# Patient Record
Sex: Male | Born: 1955 | Race: White | Hispanic: No | Marital: Married | Smoking: Former smoker
Health system: Southern US, Community
[De-identification: ages and names within clinical notes are randomized; demographics above are authoritative.]

## PROBLEM LIST (undated history)

## (undated) DIAGNOSIS — F988 Other specified behavioral and emotional disorders with onset usually occurring in childhood and adolescence: Secondary | ICD-10-CM

## (undated) DIAGNOSIS — F909 Attention-deficit hyperactivity disorder, unspecified type: Secondary | ICD-10-CM

## (undated) DIAGNOSIS — K219 Gastro-esophageal reflux disease without esophagitis: Secondary | ICD-10-CM

## (undated) DIAGNOSIS — M1990 Unspecified osteoarthritis, unspecified site: Secondary | ICD-10-CM

## (undated) DIAGNOSIS — Z87442 Personal history of urinary calculi: Secondary | ICD-10-CM

## (undated) DIAGNOSIS — I10 Essential (primary) hypertension: Secondary | ICD-10-CM

## (undated) HISTORY — PX: JOINT REPLACEMENT: SHX530

## (undated) HISTORY — PX: TONSILLECTOMY: SUR1361

## (undated) HISTORY — PX: KNEE SURGERY: SHX244

## (undated) HISTORY — PX: CHOLECYSTECTOMY: SHX55

## (undated) HISTORY — PX: GASTRIC BYPASS: SHX52

## (undated) HISTORY — PX: SHOULDER ARTHROSCOPY WITH SUBACROMIAL DECOMPRESSION: SHX5684

## (undated) HISTORY — PX: THUMB ARTHROSCOPY: SHX2509

---

## 2005-03-04 ENCOUNTER — Other Ambulatory Visit: Payer: Self-pay

## 2005-03-11 ENCOUNTER — Ambulatory Visit: Payer: Self-pay | Admitting: General Practice

## 2005-06-01 ENCOUNTER — Encounter: Payer: Self-pay | Admitting: General Practice

## 2006-06-21 ENCOUNTER — Ambulatory Visit: Payer: Self-pay

## 2006-08-02 ENCOUNTER — Other Ambulatory Visit: Payer: Self-pay

## 2006-08-15 ENCOUNTER — Ambulatory Visit: Payer: Self-pay | Admitting: General Practice

## 2007-01-16 ENCOUNTER — Ambulatory Visit: Payer: Self-pay

## 2007-03-07 ENCOUNTER — Ambulatory Visit: Payer: Self-pay | Admitting: General Practice

## 2007-09-15 ENCOUNTER — Ambulatory Visit: Payer: Self-pay

## 2007-11-23 ENCOUNTER — Ambulatory Visit: Payer: Self-pay | Admitting: General Practice

## 2010-04-26 ENCOUNTER — Ambulatory Visit: Payer: Self-pay | Admitting: General Practice

## 2010-05-12 ENCOUNTER — Inpatient Hospital Stay: Payer: Self-pay | Admitting: General Practice

## 2011-03-01 IMAGING — CR DG KNEE 1-2V*R*
1 series · 2 of 2 positions shown · non-contrast
Comparison: none

REASON FOR EXAM: postop
COMMENTS:   Bedside (portable):Y

PROCEDURE:     DXR - DXR KNEE RIGHT AP AND LATERAL  - May 12, 2010 [DATE]
RESULT:     The patient is status post right knee replacement. No fracture
about the femoral or tibial prosthetic components is seen. There is no
dislocation at the prosthetic knee joint. Surgical drains are present.

[Series 1: view not recorded · 0.17mm/px · 2 of 2 slices shown]
[im 1/2]
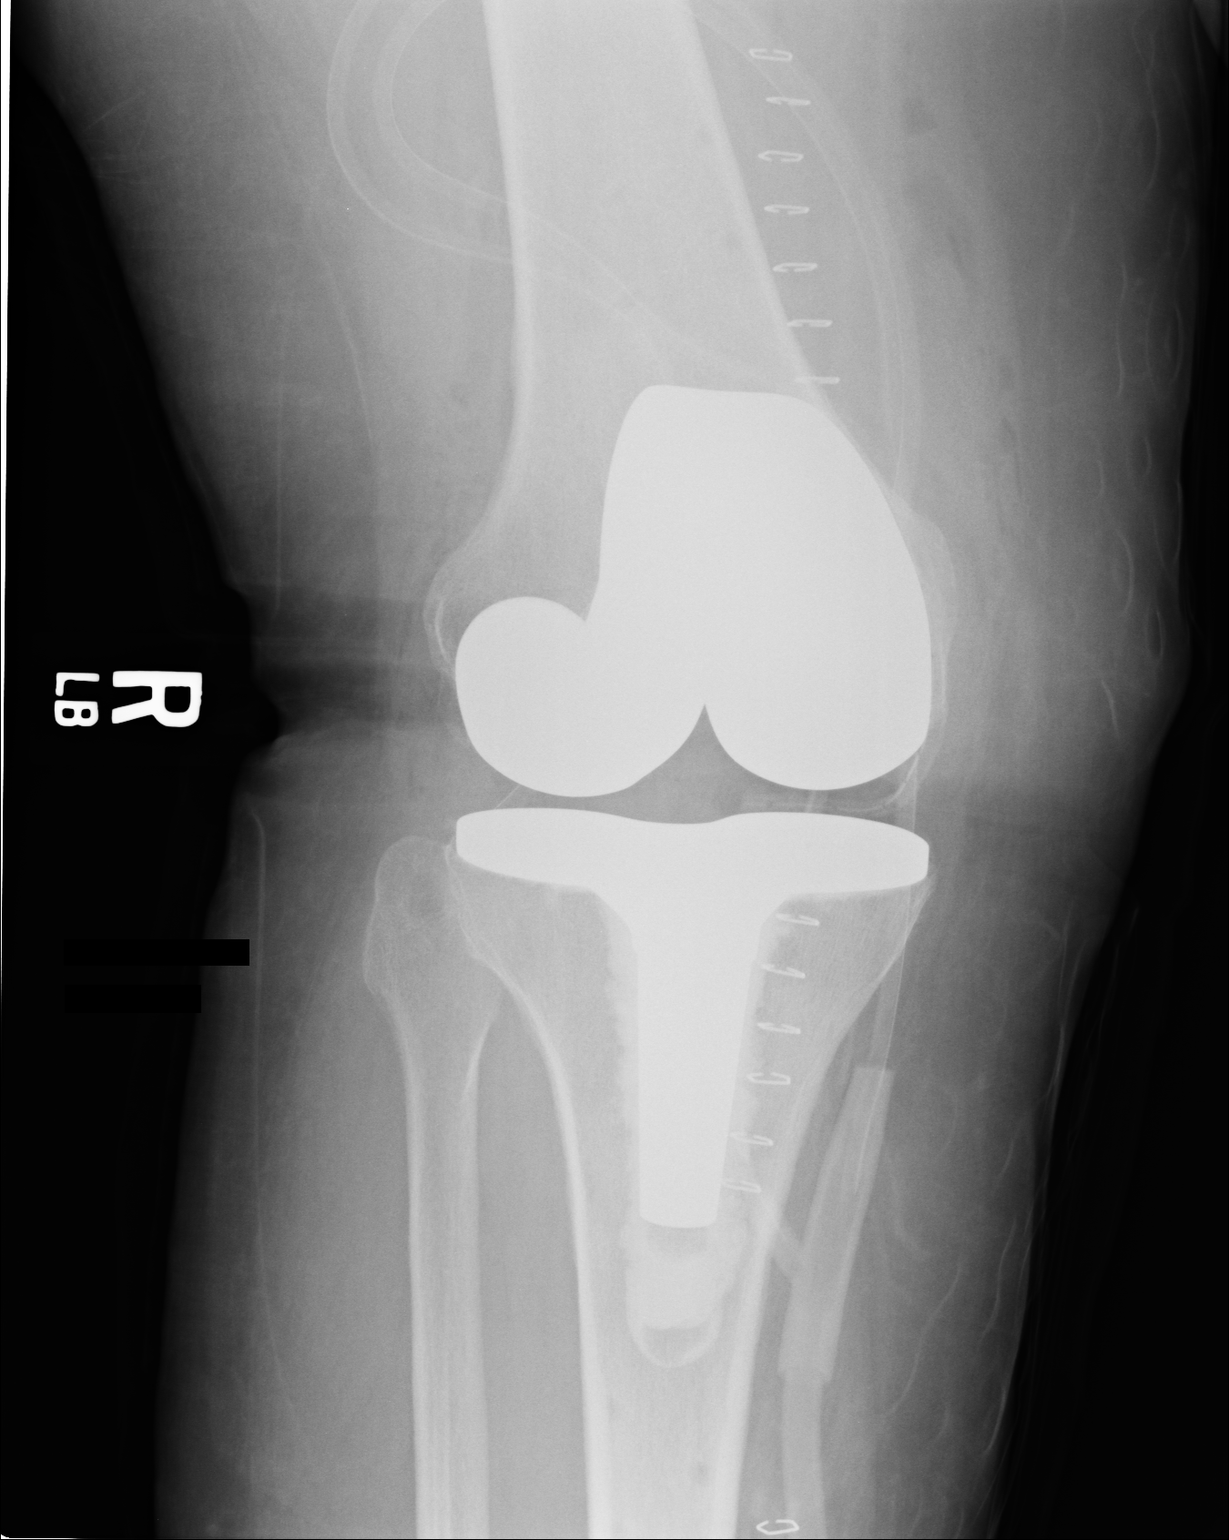
[im 2/2]
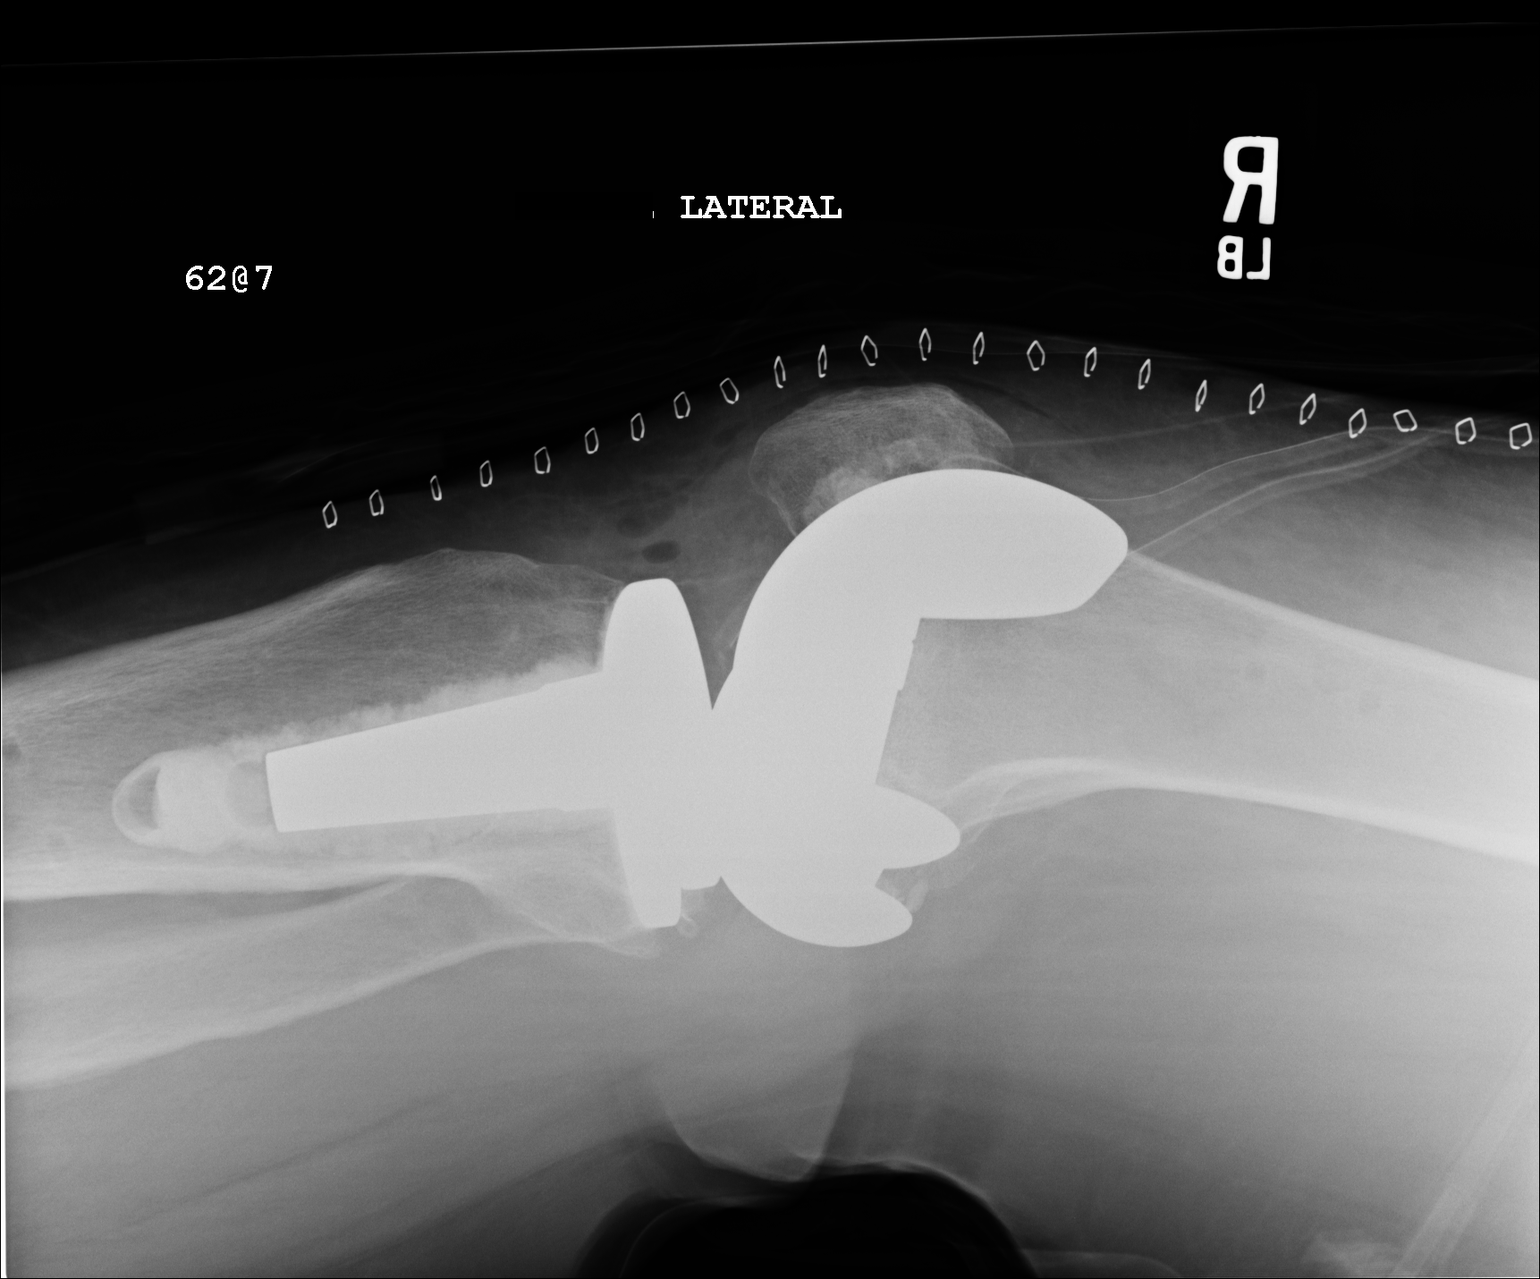

[2 of 2 positions shown; findings below may reference images not displayed]

IMPRESSION: 1.     The patient is status post right knee replacement. No abnormal
postoperative changes are identified.

## 2015-06-23 ENCOUNTER — Emergency Department
Admission: EM | Admit: 2015-06-23 | Discharge: 2015-06-23 | Disposition: A | Discharge status: Home / Self Care | Payer: BC Managed Care – PPO | Attending: Emergency Medicine | Admitting: Emergency Medicine

## 2015-06-23 ENCOUNTER — Emergency Department: Payer: BC Managed Care – PPO

## 2015-06-23 ENCOUNTER — Encounter: Payer: Self-pay | Admitting: Emergency Medicine

## 2015-06-23 DIAGNOSIS — Z79899 Other long term (current) drug therapy: Secondary | ICD-10-CM | POA: Diagnosis not present

## 2015-06-23 DIAGNOSIS — R1012 Left upper quadrant pain: Secondary | ICD-10-CM | POA: Diagnosis not present

## 2015-06-23 DIAGNOSIS — Z880 Allergy status to penicillin: Secondary | ICD-10-CM | POA: Diagnosis not present

## 2015-06-23 DIAGNOSIS — R109 Unspecified abdominal pain: Secondary | ICD-10-CM

## 2015-06-23 DIAGNOSIS — Z720 Tobacco use: Secondary | ICD-10-CM | POA: Diagnosis not present

## 2015-06-23 DIAGNOSIS — R52 Pain, unspecified: Secondary | ICD-10-CM

## 2015-06-23 DIAGNOSIS — I10 Essential (primary) hypertension: Secondary | ICD-10-CM | POA: Diagnosis not present

## 2015-06-23 HISTORY — DX: Attention-deficit hyperactivity disorder, unspecified type: F90.9

## 2015-06-23 HISTORY — DX: Other specified behavioral and emotional disorders with onset usually occurring in childhood and adolescence: F98.8

## 2015-06-23 HISTORY — DX: Essential (primary) hypertension: I10

## 2015-06-23 LAB — URINALYSIS COMPLETE WITH MICROSCOPIC (ARMC ONLY)
Bilirubin Urine: NEGATIVE
Glucose, UA: NEGATIVE mg/dL
Leukocytes, UA: NEGATIVE
Nitrite: NEGATIVE
PH: 5 (ref 5.0–8.0)
PROTEIN: NEGATIVE mg/dL
Specific Gravity, Urine: 1.011 (ref 1.005–1.030)

## 2015-06-23 LAB — CBC WITH DIFFERENTIAL/PLATELET
BASOS PCT: 0 %
Basophils Absolute: 0 10*3/uL (ref 0–0.1)
EOS ABS: 0 10*3/uL (ref 0–0.7)
EOS PCT: 1 %
HCT: 42.6 % (ref 40.0–52.0)
HEMOGLOBIN: 13.5 g/dL (ref 13.0–18.0)
Lymphocytes Relative: 21 %
Lymphs Abs: 1.9 10*3/uL (ref 1.0–3.6)
MCH: 26.8 pg (ref 26.0–34.0)
MCHC: 31.6 g/dL — AB (ref 32.0–36.0)
MCV: 84.9 fL (ref 80.0–100.0)
MONOS PCT: 7 %
Monocytes Absolute: 0.7 10*3/uL (ref 0.2–1.0)
NEUTROS PCT: 71 %
Neutro Abs: 6.4 10*3/uL (ref 1.4–6.5)
PLATELETS: 213 10*3/uL (ref 150–440)
RBC: 5.02 MIL/uL (ref 4.40–5.90)
RDW: 15 % — ABNORMAL HIGH (ref 11.5–14.5)
WBC: 9.1 10*3/uL (ref 3.8–10.6)

## 2015-06-23 LAB — BASIC METABOLIC PANEL
ANION GAP: 11 (ref 5–15)
BUN: 19 mg/dL (ref 6–20)
CALCIUM: 9.1 mg/dL (ref 8.9–10.3)
CO2: 23 mmol/L (ref 22–32)
Chloride: 105 mmol/L (ref 101–111)
Creatinine, Ser: 0.91 mg/dL (ref 0.61–1.24)
GFR calc Af Amer: >60 mL/min (ref >60)
GFR calc non Af Amer: >60 mL/min (ref >60)
GLUCOSE: 94 mg/dL (ref 65–99)
Potassium: 4 mmol/L (ref 3.5–5.1)
SODIUM: 139 mmol/L (ref 135–145)

## 2015-06-23 MED ORDER — SODIUM CHLORIDE 0.9 % IV BOLUS (SEPSIS)
1000.0000 mL | Freq: Once | INTRAVENOUS | Status: AC
  Administered 2015-06-23: 1000 mL via INTRAVENOUS

## 2015-06-23 MED ORDER — ONDANSETRON HCL 4 MG/2ML IJ SOLN
4.0000 mg | Freq: Once | INTRAMUSCULAR | Status: AC
  Administered 2015-06-23: 4 mg via INTRAVENOUS
  Filled 2015-06-23: qty 2

## 2015-06-23 MED ORDER — OXYCODONE-ACETAMINOPHEN 5-325 MG PO TABS: 1.0000 | ORAL_TABLET | Freq: Four times a day (QID) | ORAL | Status: DC | PRN

## 2015-06-23 MED ORDER — HYDROMORPHONE HCL 1 MG/ML IJ SOLN
INTRAMUSCULAR | Status: AC
  Administered 2015-06-23: 1 mg via INTRAVENOUS
  Filled 2015-06-23: qty 1

## 2015-06-23 MED ORDER — HYDROMORPHONE HCL 1 MG/ML IJ SOLN
1.0000 mg | Freq: Once | INTRAMUSCULAR | Status: AC
  Administered 2015-06-23: 1 mg via INTRAVENOUS
  Filled 2015-06-23: qty 1

## 2015-06-23 MED ORDER — OXYCODONE-ACETAMINOPHEN 5-325 MG PO TABS
1.0000 | ORAL_TABLET | Freq: Once | ORAL | Status: AC
  Administered 2015-06-23: 1 via ORAL
  Filled 2015-06-23: qty 1

## 2015-06-23 MED ORDER — HYDROMORPHONE HCL 1 MG/ML IJ SOLN
1.0000 mg | Freq: Once | INTRAMUSCULAR | Status: AC
  Administered 2015-06-23: 1 mg via INTRAVENOUS

## 2015-06-23 NOTE — ED Provider Notes (Signed)
[facility] Emergency Department Provider Note  ____________________________________________  Time seen: Approximately 7:42 PM  I have reviewed the triage vital signs and the nursing notes.   HISTORY  Chief Complaint Flank Pain    HPI Devon Olsen is a 59 y.o. male patient reports he was woken up at 6:00 this morning by left flank pain. This pain worsened over the course of day and feels exactly like the kidney stone pain he's had in the past. Patient reports he was diagnosed kidney stone and passed the stones which he caught a strainer but he did not ever have a CT scan before. Patient reports his past medical history includes gastric bypass with complications. He also has a bad shoulder bad knees. This pain however it feels exactly the same as previous kidney stone pain. It is severe in nature and crampy   Past Medical History  Diagnosis Date  . Hypertension   . ADHD (attention deficit hyperactivity disorder)   . ADD (attention deficit disorder)     There are no active problems to display for this patient.   Past Surgical History  Procedure Laterality Date  . Knee surgery    . Gastric bypass    . Cholecystectomy      Current Outpatient Rx  Name  Route  Sig  Dispense  Refill  . amphetamine-dextroamphetamine (ADDERALL XR) 30 MG 24 hr capsule   Oral   Take 30 mg by mouth daily.         . celecoxib (CELEBREX) 200 MG capsule   Oral   Take 200 mg by mouth daily.         Marland Kitchen FLUoxetine (PROZAC) 20 MG capsule   Oral   Take 20 mg by mouth daily.         . Hydrocodone-Acetaminophen 7.5-300 MG TABS   Oral   Take 1 tablet by mouth 2 (two) times daily as needed (for pain).         . Multiple Vitamin (MULTIVITAMIN WITH MINERALS) TABS tablet   Oral   Take 1 tablet by mouth daily.         Marland Kitchen omeprazole (PRILOSEC) 40 MG capsule   Oral   Take 40 mg by mouth daily.         . tamsulosin (FLOMAX) 0.4 MG CAPS capsule   Oral   Take 0.4  mg by mouth daily.         . Tapentadol HCl (NUCYNTA ER) 100 MG TB12   Oral   Take 100 mg by mouth every 12 (twelve) hours.         Marland Kitchen oxyCODONE-acetaminophen (ROXICET) 5-325 MG per tablet   Oral   Take 1 tablet by mouth every 6 (six) hours as needed.   20 tablet   0     Allergies Penicillins  No family history on file.  Social History Social History  Substance Use Topics  . Smoking status: Current Every Day Smoker  . Smokeless tobacco: Never Used  . Alcohol Use: No    Review of Systems Constitutional: No fever/chills Eyes: No visual changes. ENT: No sore throat. Cardiovascular: Denies chest pain. Respiratory: Denies shortness of breath. Gastrointestinal: No nausea, no vomiting.  No diarrhea.  No constipation. Skin: Negative for rash. Neurological: Negative for headaches, focal weakness or numbness.  10-point ROS otherwise negative.  ____________________________________________   PHYSICAL EXAM:  VITAL SIGNS: ED Triage Vitals  Enc Vitals Group     BP 06/23/15 1911 158/88 mmHg  Pulse Rate 06/23/15 1911 72     Resp 06/23/15 1911 16     Temp 06/23/15 1911 97.9 F (36.6 C)     Temp Source 06/23/15 1911 Oral     SpO2 06/23/15 1911 100 %     Weight 06/23/15 1911 277 lb (125.646 kg)     Height 06/23/15 1911 5\' 11"  (1.803 m)     Head Cir --      Peak Flow --      Pain Score 06/23/15 1917 10     Pain Loc --      Pain Edu? --      Excl. in GC? --     Constitutional: Alert and oriented. Well appearing but in  distress. Eyes: Conjunctivae are normal. PERRL. EOMI. Head: Atraumatic. Nose: No congestion/rhinnorhea. Mouth/Throat: Mucous membranes are moist.  Oropharynx non-erythematous. Neck: No stridor. Cardiovascular: Normal rate, regular rhythm. Grossly normal heart sounds.  Good peripheral circulation. Respiratory: Normal respiratory effort.  No retractions. Lungs CTAB. Gastrointestinal: Soft some tenderness in the left upper quadrant. No distention.  No abdominal bruits. No CVA tenderness. Musculoskeletal: No lower extremity tenderness nor edema.  No joint effusions. Neurologic:  Normal speech and language. No gross focal neurologic deficits are appreciated. No gait instability. Skin:  Skin is warm, dry and intact. No rash noted. Psychiatric: Mood and affect are normal. Speech and behavior are normal.  ____________________________________________   LABS (all labs ordered are listed, but only abnormal results are displayed)  Labs Reviewed  URINALYSIS COMPLETEWITH MICROSCOPIC ([facility] ONLY) - Abnormal; Notable for the following:    Color, Urine YELLOW (*)    APPearance CLEAR (*)    Ketones, ur TRACE (*)    Hgb urine dipstick 1+ (*)    Bacteria, UA RARE (*)    Squamous Epithelial / LPF 0-5 (*)    All other components within normal limits  CBC WITH DIFFERENTIAL/PLATELET - Abnormal; Notable for the following:    MCHC 31.6 (*)    RDW 15.0 (*)    All other components within normal limits  BASIC METABOLIC PANEL   ____________________________________________  EKG   ____________________________________________  RADIOLOGY  CT report reviewed patient informed of the calcification in the penis and the liver lesion and the need for following up ____________________________________________   PROCEDURES    ____________________________________________   INITIAL IMPRESSION / ASSESSMENT AND PLAN / ED COURSE  Pertinent labs & imaging results that were available during my care of the patient were reviewed by me and considered in my medical decision making (see chart for details). Patient reports pain is gone down to about a 5 out of 10 a does not feel the need to urinate so much sure what the calcification that appears to be in the ureter is I discussed again all this with him ____________________________________________   FINAL CLINICAL IMPRESSION(S) / ED DIAGNOSES  Final diagnoses:  Pain  Abdominal pain, unspecified abdominal  location      Arnaldo Natal, MD 06/23/15 2344

## 2015-06-23 NOTE — Discharge Instructions (Signed)
Abdominal Pain Many things can cause abdominal pain. Usually, abdominal pain is not caused by a disease and will improve without treatment. It can often be observed and treated at home. Your health care provider will do a physical exam and possibly order blood tests and X-rays to help determine the seriousness of your pain. However, in many cases, more time must pass before a clear cause of the pain can be found. Before that point, your health care provider may not know if you need more testing or further treatment. HOME CARE INSTRUCTIONS  Monitor your abdominal pain for any changes. The following actions may help to alleviate any discomfort you are experiencing:  Only take over-the-counter or prescription medicines as directed by your health care provider.  Do not take laxatives unless directed to do so by your health care provider.  Try a clear liquid diet (broth, tea, or water) as directed by your health care provider. Slowly move to a bland diet as tolerated. SEEK MEDICAL CARE IF:  You have unexplained abdominal pain.  You have abdominal pain associated with nausea or diarrhea.  You have pain when you urinate or have a bowel movement.  You experience abdominal pain that wakes you in the night.  You have abdominal pain that is worsened or improved by eating food.  You have abdominal pain that is worsened with eating fatty foods.  You have a fever. SEEK IMMEDIATE MEDICAL CARE IF:   Your pain does not go away within 2 hours.  You keep throwing up (vomiting).  Your pain is felt only in portions of the abdomen, such as the right side or the left lower portion of the abdomen.  You pass bloody or black tarry stools. MAKE SURE YOU:  Understand these instructions.   Will watch your condition.   Will get help right away if you are not doing well or get worse.  Document Released: 07/27/2005 Document Revised: 10/22/2013 Document Reviewed: 06/26/2013 [facility] Patient Information  2015 [place], [place]. This information is not intended to replace advice given to you by your health care provider. Make sure you discuss any questions you have with your health care provider.  Please follow up with your doctor. Have them look at the CT report about the spot on your liver. Please strain all urine. If you catch the stone take it to your urologist or have your doctor refer you to a urologist.

## 2015-06-23 NOTE — ED Notes (Signed)
Pt  Presents to ED with left sided flank pain that started around 0600 this morning. Pt states it feels like a kidney stone. Has a hx of the same; last time was approx 1 year ago. Denies nausea/vomiting or difficulty urinating.

## 2015-06-23 NOTE — ED Notes (Signed)
Pt aware of need for urine  

## 2015-06-23 NOTE — ED Notes (Signed)
Patient transported to X-ray 

## 2015-06-23 NOTE — ED Notes (Signed)
Pt still aware need for urine.  Drinking water and receiving IV fluids

## 2015-06-23 NOTE — ED Notes (Signed)
Patient transported to CT 

## 2015-10-13 DIAGNOSIS — Z01812 Encounter for preprocedural laboratory examination: Secondary | ICD-10-CM | POA: Diagnosis not present

## 2015-10-13 DIAGNOSIS — Z01810 Encounter for preprocedural cardiovascular examination: Secondary | ICD-10-CM | POA: Diagnosis present

## 2015-10-13 LAB — TYPE AND SCREEN
ABO/RH(D): O POS
ANTIBODY SCREEN: NEGATIVE

## 2015-10-13 LAB — BASIC METABOLIC PANEL
Anion gap: 10 (ref 5–15)
BUN: 22 mg/dL — AB (ref 6–20)
CHLORIDE: 103 mmol/L (ref 101–111)
CO2: 29 mmol/L (ref 22–32)
CREATININE: 0.75 mg/dL (ref 0.61–1.24)
Calcium: 8.9 mg/dL (ref 8.9–10.3)
Glucose, Bld: 84 mg/dL (ref 65–99)
POTASSIUM: 3.3 mmol/L — AB (ref 3.5–5.1)
SODIUM: 142 mmol/L (ref 135–145)

## 2015-10-13 LAB — CBC
HCT: 43.7 % (ref 40.0–52.0)
HEMOGLOBIN: 14 g/dL (ref 13.0–18.0)
MCH: 27.6 pg (ref 26.0–34.0)
MCHC: 31.9 g/dL — ABNORMAL LOW (ref 32.0–36.0)
MCV: 86.5 fL (ref 80.0–100.0)
PLATELETS: 175 10*3/uL (ref 150–440)
RBC: 5.05 MIL/uL (ref 4.40–5.90)
RDW: 15.7 % — ABNORMAL HIGH (ref 11.5–14.5)
WBC: 7.8 10*3/uL (ref 3.8–10.6)

## 2015-10-13 LAB — URINALYSIS COMPLETE WITH MICROSCOPIC (ARMC ONLY)
BACTERIA UA: NONE SEEN
BILIRUBIN URINE: NEGATIVE
Glucose, UA: NEGATIVE mg/dL
HGB URINE DIPSTICK: NEGATIVE
Leukocytes, UA: NEGATIVE
NITRITE: NEGATIVE
PH: 6 (ref 5.0–8.0)
PROTEIN: NEGATIVE mg/dL
Specific Gravity, Urine: 1.023 (ref 1.005–1.030)

## 2015-10-13 LAB — PROTIME-INR
INR: 1.03
PROTHROMBIN TIME: 13.7 s (ref 11.4–15.0)

## 2015-10-13 LAB — APTT: aPTT: 26 seconds (ref 24–36)

## 2015-10-13 LAB — SURGICAL PCR SCREEN
MRSA, PCR: NEGATIVE
STAPHYLOCOCCUS AUREUS: NEGATIVE

## 2015-10-13 LAB — ABO/RH: ABO/RH(D): O POS

## 2015-10-13 NOTE — Patient Instructions (Signed)
  Your procedure is scheduled on: October 28, 2015 (Wednesday) Report to Day Surgery.([facility])Second Floor To find out your arrival time please call [phone] between 1PM - 3PM on October 27, 2015 (Tuesday).  Remember: Instructions that are not followed completely may result in serious medical risk, up to and including death, or upon the discretion of your surgeon and anesthesiologist your surgery may need to be rescheduled.    __x__ 1. Do not eat food or drink liquids after midnight. No gum chewing or hard candies.     ____ 2. No Alcohol for 24 hours before or after surgery.   ____ 3. Bring all medications with you on the day of surgery if instructed.    __x__ 4. Notify your doctor if there is any change in your medical condition     (cold, fever, infections).     Do not wear jewelry, make-up, hairpins, clips or nail polish.  Do not wear lotions, powders, or perfumes. You may wear deodorant.  Do not shave 48 hours prior to surgery. Men may shave face and neck.  Do not bring valuables to the hospital.    [facility] is not responsible for any belongings or valuables.               Contacts, dentures or bridgework may not be worn into surgery.  Leave your suitcase in the car. After surgery it may be brought to your room.  For patients admitted to the hospital, discharge time is determined by your                treatment team.   Patients discharged the day of surgery will not be allowed to drive home.   Please read over the following fact sheets that you were given:   MRSA Information and Surgical Site Infection Prevention   __x__ Take these medicines the morning of surgery with A SIP OF WATER:    1. Omeprazole (Omeprazole at bedtime on December 27)  2. Prozac  3.   4.  5.  6.  ____ Fleet Enema (as directed)   __x__ Use CHG Soap as directed  ____ Use inhalers on the day of surgery  ____ Stop metformin 2 days prior to surgery    ____ Take 1/2 of usual insulin  dose the night before surgery and none on the morning of surgery.   ____ Stop Coumadin/Plavix/aspirin on   ____ Stop Anti-inflammatories on    ____ Stop supplements until after surgery.    ____ Bring C-Pap to the hospital.

## 2015-10-13 NOTE — Pre-Procedure Instructions (Signed)
Potassium results (3.3) called and faxed to Dr. Ernest PineHooten office

## 2015-10-14 ENCOUNTER — Encounter
Admission: RE | Admit: 2015-10-14 | Discharge: 2015-10-14 | Disposition: A | Discharge status: Home / Self Care | Payer: BC Managed Care – PPO | Source: Ambulatory Visit | Attending: Orthopedic Surgery | Admitting: Orthopedic Surgery

## 2015-10-14 ENCOUNTER — Other Ambulatory Visit
Admission: RE | Admit: 2015-10-14 | Discharge: 2015-10-14 | Disposition: A | Discharge status: Home / Self Care | Payer: BC Managed Care – PPO | Source: Ambulatory Visit | Attending: Orthopedic Surgery | Admitting: Orthopedic Surgery

## 2015-10-14 DIAGNOSIS — Z01812 Encounter for preprocedural laboratory examination: Secondary | ICD-10-CM | POA: Diagnosis not present

## 2015-10-14 DIAGNOSIS — Z01810 Encounter for preprocedural cardiovascular examination: Secondary | ICD-10-CM | POA: Insufficient documentation

## 2015-10-14 HISTORY — DX: Unspecified osteoarthritis, unspecified site: M19.90

## 2015-10-14 HISTORY — DX: Gastro-esophageal reflux disease without esophagitis: K21.9

## 2015-10-14 LAB — URINE CULTURE: Culture: NO GROWTH

## 2015-10-14 LAB — POTASSIUM: POTASSIUM: 3.7 mmol/L (ref 3.5–5.1)

## 2015-10-22 ENCOUNTER — Other Ambulatory Visit
Admission: RE | Admit: 2015-10-22 | Discharge: 2015-10-22 | Disposition: A | Discharge status: Home / Self Care | Payer: BC Managed Care – PPO | Source: Ambulatory Visit | Attending: Orthopedic Surgery | Admitting: Orthopedic Surgery

## 2015-10-22 DIAGNOSIS — M1712 Unilateral primary osteoarthritis, left knee: Secondary | ICD-10-CM | POA: Diagnosis not present

## 2015-10-22 DIAGNOSIS — Z01812 Encounter for preprocedural laboratory examination: Secondary | ICD-10-CM | POA: Insufficient documentation

## 2015-10-22 LAB — SEDIMENTATION RATE: SED RATE: 7 mm/h (ref 0–20)

## 2015-10-28 ENCOUNTER — Inpatient Hospital Stay: Payer: BC Managed Care – PPO

## 2015-10-28 ENCOUNTER — Inpatient Hospital Stay: Payer: BC Managed Care – PPO | Admitting: Certified Registered"

## 2015-10-28 ENCOUNTER — Inpatient Hospital Stay
Admission: RE | Admit: 2015-10-28 | Discharge: 2015-10-31 | DRG: 470 | Disposition: A | Discharge status: Home Health | Payer: BC Managed Care – PPO | Source: Ambulatory Visit | Attending: Orthopedic Surgery | Admitting: Orthopedic Surgery

## 2015-10-28 ENCOUNTER — Encounter
Admission: RE | Disposition: A | Discharge status: Home Health | Payer: Self-pay | Source: Ambulatory Visit | Attending: Orthopedic Surgery

## 2015-10-28 ENCOUNTER — Encounter: Payer: Self-pay | Admitting: *Deleted

## 2015-10-28 DIAGNOSIS — M1712 Unilateral primary osteoarthritis, left knee: Secondary | ICD-10-CM | POA: Diagnosis present

## 2015-10-28 DIAGNOSIS — Z880 Allergy status to penicillin: Secondary | ICD-10-CM

## 2015-10-28 DIAGNOSIS — Z79899 Other long term (current) drug therapy: Secondary | ICD-10-CM | POA: Diagnosis not present

## 2015-10-28 DIAGNOSIS — K219 Gastro-esophageal reflux disease without esophagitis: Secondary | ICD-10-CM | POA: Diagnosis present

## 2015-10-28 DIAGNOSIS — I10 Essential (primary) hypertension: Secondary | ICD-10-CM | POA: Diagnosis present

## 2015-10-28 DIAGNOSIS — F909 Attention-deficit hyperactivity disorder, unspecified type: Secondary | ICD-10-CM | POA: Diagnosis present

## 2015-10-28 DIAGNOSIS — Z96659 Presence of unspecified artificial knee joint: Secondary | ICD-10-CM

## 2015-10-28 DIAGNOSIS — Z8249 Family history of ischemic heart disease and other diseases of the circulatory system: Secondary | ICD-10-CM | POA: Diagnosis not present

## 2015-10-28 HISTORY — PX: KNEE ARTHROPLASTY: SHX992

## 2015-10-28 LAB — TYPE AND SCREEN
ABO/RH(D): O POS
ANTIBODY SCREEN: NEGATIVE

## 2015-10-28 LAB — POCT I-STAT 4, (NA,K, GLUC, HGB,HCT)
Glucose, Bld: 85 mg/dL (ref 65–99)
HCT: 43 % (ref 39.0–52.0)
HEMOGLOBIN: 14.6 g/dL (ref 13.0–17.0)
POTASSIUM: 3.7 mmol/L (ref 3.5–5.1)
SODIUM: 143 mmol/L (ref 135–145)

## 2015-10-28 SURGERY — ARTHROPLASTY, KNEE, TOTAL, USING IMAGELESS COMPUTER-ASSISTED NAVIGATION
Anesthesia: General | Laterality: Left

## 2015-10-28 MED ORDER — ADULT MULTIVITAMIN W/MINERALS CH
1.0000 | ORAL_TABLET | Freq: Every day | ORAL | Status: DC
  Administered 2015-10-29 – 2015-10-31 (×3): 1 via ORAL
  Filled 2015-10-28 (×3): qty 1

## 2015-10-28 MED ORDER — HYDROMORPHONE HCL 1 MG/ML IJ SOLN
INTRAMUSCULAR | Status: AC
  Filled 2015-10-28: qty 2

## 2015-10-28 MED ORDER — MENTHOL 3 MG MT LOZG: 1.0000 | LOZENGE | OROMUCOSAL | Status: DC | PRN

## 2015-10-28 MED ORDER — SODIUM CHLORIDE 0.9 % IJ SOLN
INTRAMUSCULAR | Status: AC
  Filled 2015-10-28: qty 50

## 2015-10-28 MED ORDER — BUPIVACAINE-EPINEPHRINE 0.25% -1:200000 IJ SOLN
INTRAMUSCULAR | Status: DC | PRN
  Administered 2015-10-28: 30 mL

## 2015-10-28 MED ORDER — AMPHETAMINE-DEXTROAMPHET ER 30 MG PO CP24
30.0000 mg | ORAL_CAPSULE | Freq: Every day | ORAL | Status: DC
  Administered 2015-10-29 – 2015-10-31 (×3): 30 mg via ORAL
  Filled 2015-10-28 (×3): qty 1

## 2015-10-28 MED ORDER — FLEET ENEMA 7-19 GM/118ML RE ENEM: 1.0000 | ENEMA | Freq: Once | RECTAL | Status: DC | PRN

## 2015-10-28 MED ORDER — ONDANSETRON HCL 4 MG/2ML IJ SOLN
INTRAMUSCULAR | Status: DC | PRN
  Administered 2015-10-28: 4 mg via INTRAVENOUS

## 2015-10-28 MED ORDER — ACETAMINOPHEN 325 MG PO TABS: 650.0000 mg | ORAL_TABLET | Freq: Four times a day (QID) | ORAL | Status: DC | PRN

## 2015-10-28 MED ORDER — CLINDAMYCIN PHOSPHATE 600 MG/50ML IV SOLN
600.0000 mg | Freq: Four times a day (QID) | INTRAVENOUS | Status: AC
  Administered 2015-10-28 – 2015-10-29 (×4): 600 mg via INTRAVENOUS
  Filled 2015-10-28 (×4): qty 50

## 2015-10-28 MED ORDER — ONDANSETRON HCL 4 MG/2ML IJ SOLN: 4.0000 mg | Freq: Four times a day (QID) | INTRAMUSCULAR | Status: DC | PRN

## 2015-10-28 MED ORDER — LABETALOL HCL 5 MG/ML IV SOLN
INTRAVENOUS | Status: AC
  Filled 2015-10-28: qty 4

## 2015-10-28 MED ORDER — PROMETHAZINE HCL 25 MG/ML IJ SOLN
INTRAMUSCULAR | Status: AC
  Filled 2015-10-28: qty 1

## 2015-10-28 MED ORDER — MIDAZOLAM HCL 2 MG/2ML IJ SOLN
1.0000 mg | INTRAMUSCULAR | Status: DC | PRN
  Administered 2015-10-28 (×3): 1 mg via INTRAVENOUS

## 2015-10-28 MED ORDER — SODIUM CHLORIDE 0.9 % IJ SOLN
INTRAMUSCULAR | Status: AC
  Filled 2015-10-28: qty 10

## 2015-10-28 MED ORDER — PROPOFOL 10 MG/ML IV BOLUS
INTRAVENOUS | Status: DC | PRN
  Administered 2015-10-28: 100 mg via INTRAVENOUS
  Administered 2015-10-28: 250 mg via INTRAVENOUS

## 2015-10-28 MED ORDER — NEOSTIGMINE METHYLSULFATE 10 MG/10ML IV SOLN
INTRAVENOUS | Status: DC | PRN
  Administered 2015-10-28: 3 mg via INTRAVENOUS

## 2015-10-28 MED ORDER — CLINDAMYCIN PHOSPHATE 900 MG/50ML IV SOLN: 900.0000 mg | Freq: Once | INTRAVENOUS | Status: DC

## 2015-10-28 MED ORDER — OXYCODONE HCL 5 MG PO TABS
5.0000 mg | ORAL_TABLET | ORAL | Status: DC | PRN
  Administered 2015-10-28 – 2015-10-30 (×10): 10 mg via ORAL
  Administered 2015-10-31 (×3): 5 mg via ORAL
  Filled 2015-10-28: qty 2
  Filled 2015-10-28: qty 1
  Filled 2015-10-28 (×6): qty 2
  Filled 2015-10-28 (×2): qty 1
  Filled 2015-10-28 (×3): qty 2

## 2015-10-28 MED ORDER — FERROUS SULFATE 325 (65 FE) MG PO TABS
325.0000 mg | ORAL_TABLET | Freq: Two times a day (BID) | ORAL | Status: DC
  Administered 2015-10-29 – 2015-10-31 (×5): 325 mg via ORAL
  Filled 2015-10-28 (×5): qty 1

## 2015-10-28 MED ORDER — GLYCOPYRROLATE 0.2 MG/ML IJ SOLN
INTRAMUSCULAR | Status: DC | PRN
  Administered 2015-10-28: 0.4 mg via INTRAVENOUS
  Administered 2015-10-28: 0.2 mg via INTRAVENOUS

## 2015-10-28 MED ORDER — ENOXAPARIN SODIUM 30 MG/0.3ML ~~LOC~~ SOLN
30.0000 mg | Freq: Two times a day (BID) | SUBCUTANEOUS | Status: DC
  Administered 2015-10-29 – 2015-10-31 (×5): 30 mg via SUBCUTANEOUS
  Filled 2015-10-28 (×5): qty 0.3

## 2015-10-28 MED ORDER — KETAMINE HCL 10 MG/ML IJ SOLN
INTRAMUSCULAR | Status: DC | PRN
  Administered 2015-10-28: 20 mg via INTRAVENOUS
  Administered 2015-10-28: 30 mg via INTRAVENOUS

## 2015-10-28 MED ORDER — MIDAZOLAM HCL 2 MG/2ML IJ SOLN
INTRAMUSCULAR | Status: AC
  Administered 2015-10-28: 1 mg via INTRAVENOUS
  Filled 2015-10-28: qty 2

## 2015-10-28 MED ORDER — FENTANYL CITRATE (PF) 100 MCG/2ML IJ SOLN
25.0000 ug | INTRAMUSCULAR | Status: DC | PRN
  Administered 2015-10-28 (×4): 25 ug via INTRAVENOUS

## 2015-10-28 MED ORDER — ACETAMINOPHEN 10 MG/ML IV SOLN
1000.0000 mg | Freq: Four times a day (QID) | INTRAVENOUS | Status: AC
  Administered 2015-10-28 – 2015-10-29 (×4): 1000 mg via INTRAVENOUS
  Filled 2015-10-28 (×4): qty 100

## 2015-10-28 MED ORDER — ALUM & MAG HYDROXIDE-SIMETH 200-200-20 MG/5ML PO SUSP: 30.0000 mL | ORAL | Status: DC | PRN

## 2015-10-28 MED ORDER — NEOMYCIN-POLYMYXIN B GU 40-200000 IR SOLN
Status: DC | PRN
  Administered 2015-10-28: 14 mL

## 2015-10-28 MED ORDER — PHENOL 1.4 % MT LIQD: 1.0000 | OROMUCOSAL | Status: DC | PRN

## 2015-10-28 MED ORDER — HYDROMORPHONE HCL 1 MG/ML IJ SOLN
INTRAMUSCULAR | Status: AC
  Filled 2015-10-28: qty 1

## 2015-10-28 MED ORDER — TRANEXAMIC ACID 1000 MG/10ML IV SOLN
1500.0000 mg | INTRAVENOUS | Status: AC
  Administered 2015-10-28: 1500 mg via INTRAVENOUS
  Filled 2015-10-28: qty 15

## 2015-10-28 MED ORDER — SODIUM CHLORIDE 0.9 % IV SOLN
1000.0000 mg | Freq: Once | INTRAVENOUS | Status: AC
  Administered 2015-10-28: 1000 mg via INTRAVENOUS
  Filled 2015-10-28: qty 10

## 2015-10-28 MED ORDER — HYDROMORPHONE HCL 1 MG/ML IJ SOLN
0.2500 mg | INTRAMUSCULAR | Status: DC | PRN
  Administered 2015-10-28 (×8): 0.25 mg via INTRAVENOUS

## 2015-10-28 MED ORDER — LACTATED RINGERS IV SOLN
INTRAVENOUS | Status: DC
  Administered 2015-10-28 (×2): via INTRAVENOUS

## 2015-10-28 MED ORDER — PROMETHAZINE HCL 25 MG/ML IJ SOLN
12.5000 mg | Freq: Once | INTRAMUSCULAR | Status: AC
  Administered 2015-10-28: 12.5 mg via INTRAVENOUS

## 2015-10-28 MED ORDER — CELECOXIB 200 MG PO CAPS
200.0000 mg | ORAL_CAPSULE | Freq: Every day | ORAL | Status: DC
  Administered 2015-10-29 – 2015-10-31 (×3): 200 mg via ORAL
  Filled 2015-10-28 (×3): qty 1

## 2015-10-28 MED ORDER — FERROUS SULFATE 325 (65 FE) MG PO TABS: 325.0000 mg | ORAL_TABLET | Freq: Every day | ORAL | Status: DC

## 2015-10-28 MED ORDER — ONDANSETRON HCL 4 MG PO TABS: 4.0000 mg | ORAL_TABLET | Freq: Four times a day (QID) | ORAL | Status: DC | PRN

## 2015-10-28 MED ORDER — TAMSULOSIN HCL 0.4 MG PO CAPS
0.4000 mg | ORAL_CAPSULE | Freq: Every day | ORAL | Status: DC
  Administered 2015-10-29 – 2015-10-31 (×3): 0.4 mg via ORAL
  Filled 2015-10-28 (×3): qty 1

## 2015-10-28 MED ORDER — MORPHINE SULFATE (PF) 2 MG/ML IV SOLN
2.0000 mg | INTRAVENOUS | Status: DC | PRN
  Administered 2015-10-28 – 2015-10-29 (×3): 2 mg via INTRAVENOUS
  Administered 2015-10-30: 4 mg via INTRAVENOUS
  Filled 2015-10-28: qty 1
  Filled 2015-10-28: qty 2
  Filled 2015-10-28 (×2): qty 1

## 2015-10-28 MED ORDER — FENTANYL CITRATE (PF) 100 MCG/2ML IJ SOLN
INTRAMUSCULAR | Status: DC | PRN
  Administered 2015-10-28 (×5): 50 ug via INTRAVENOUS

## 2015-10-28 MED ORDER — TRAMADOL HCL 50 MG PO TABS
50.0000 mg | ORAL_TABLET | ORAL | Status: DC | PRN
  Administered 2015-10-28: 50 mg via ORAL
  Administered 2015-10-29 – 2015-10-30 (×7): 100 mg via ORAL
  Filled 2015-10-28 (×5): qty 2
  Filled 2015-10-28: qty 1
  Filled 2015-10-28 (×2): qty 2

## 2015-10-28 MED ORDER — OXYCODONE HCL 5 MG PO TABS
ORAL_TABLET | ORAL | Status: AC
  Filled 2015-10-28: qty 2

## 2015-10-28 MED ORDER — BUPIVACAINE LIPOSOME 1.3 % IJ SUSP
INTRAMUSCULAR | Status: AC
  Filled 2015-10-28: qty 20

## 2015-10-28 MED ORDER — LABETALOL HCL 5 MG/ML IV SOLN
5.0000 mg | INTRAVENOUS | Status: AC | PRN
  Administered 2015-10-28 (×2): 5 mg via INTRAVENOUS

## 2015-10-28 MED ORDER — BISACODYL 10 MG RE SUPP
10.0000 mg | Freq: Every day | RECTAL | Status: DC | PRN
  Administered 2015-10-31: 10 mg via RECTAL
  Filled 2015-10-28: qty 1

## 2015-10-28 MED ORDER — BUPIVACAINE-EPINEPHRINE (PF) 0.25% -1:200000 IJ SOLN
INTRAMUSCULAR | Status: AC
  Filled 2015-10-28: qty 30

## 2015-10-28 MED ORDER — NEOMYCIN-POLYMYXIN B GU 40-200000 IR SOLN
Status: AC
  Filled 2015-10-28: qty 20

## 2015-10-28 MED ORDER — CLINDAMYCIN PHOSPHATE 900 MG/50ML IV SOLN
INTRAVENOUS | Status: AC
  Administered 2015-10-28: 900 mg via INTRAVENOUS
  Filled 2015-10-28: qty 50

## 2015-10-28 MED ORDER — SENNOSIDES-DOCUSATE SODIUM 8.6-50 MG PO TABS
1.0000 | ORAL_TABLET | Freq: Two times a day (BID) | ORAL | Status: DC
  Administered 2015-10-28 – 2015-10-31 (×6): 1 via ORAL
  Filled 2015-10-28 (×6): qty 1

## 2015-10-28 MED ORDER — DEXAMETHASONE SODIUM PHOSPHATE 4 MG/ML IJ SOLN
INTRAMUSCULAR | Status: DC | PRN
  Administered 2015-10-28: 10 mg via INTRAVENOUS

## 2015-10-28 MED ORDER — DIPHENHYDRAMINE HCL 12.5 MG/5ML PO ELIX: 12.5000 mg | ORAL_SOLUTION | ORAL | Status: DC | PRN

## 2015-10-28 MED ORDER — FLUOXETINE HCL 20 MG PO CAPS
20.0000 mg | ORAL_CAPSULE | Freq: Every day | ORAL | Status: DC
  Administered 2015-10-29 – 2015-10-31 (×3): 20 mg via ORAL
  Filled 2015-10-28 (×3): qty 1

## 2015-10-28 MED ORDER — MAGNESIUM HYDROXIDE 400 MG/5ML PO SUSP
30.0000 mL | Freq: Every day | ORAL | Status: DC | PRN
  Administered 2015-10-29 – 2015-10-30 (×2): 30 mL via ORAL
  Filled 2015-10-28 (×2): qty 30

## 2015-10-28 MED ORDER — ACETAMINOPHEN 650 MG RE SUPP: 650.0000 mg | Freq: Four times a day (QID) | RECTAL | Status: DC | PRN

## 2015-10-28 MED ORDER — SUCCINYLCHOLINE CHLORIDE 20 MG/ML IJ SOLN
INTRAMUSCULAR | Status: DC | PRN
  Administered 2015-10-28: 100 mg via INTRAVENOUS

## 2015-10-28 MED ORDER — SODIUM CHLORIDE 0.9 % IV SOLN
INTRAVENOUS | Status: DC
  Administered 2015-10-28: via INTRAVENOUS

## 2015-10-28 MED ORDER — ACETAMINOPHEN 10 MG/ML IV SOLN
INTRAVENOUS | Status: DC | PRN
  Administered 2015-10-28: 1000 mg via INTRAVENOUS

## 2015-10-28 MED ORDER — ROCURONIUM BROMIDE 100 MG/10ML IV SOLN
INTRAVENOUS | Status: DC | PRN
  Administered 2015-10-28: 10 mg via INTRAVENOUS
  Administered 2015-10-28: 30 mg via INTRAVENOUS
  Administered 2015-10-28 (×2): 10 mg via INTRAVENOUS

## 2015-10-28 MED ORDER — LIDOCAINE HCL (CARDIAC) 20 MG/ML IV SOLN
INTRAVENOUS | Status: DC | PRN
  Administered 2015-10-28: 50 mg via INTRAVENOUS

## 2015-10-28 MED ORDER — PANTOPRAZOLE SODIUM 40 MG PO TBEC
40.0000 mg | DELAYED_RELEASE_TABLET | Freq: Two times a day (BID) | ORAL | Status: DC
  Administered 2015-10-28 – 2015-10-31 (×6): 40 mg via ORAL
  Filled 2015-10-28 (×6): qty 1

## 2015-10-28 MED ORDER — HYDROMORPHONE HCL 1 MG/ML IJ SOLN
INTRAMUSCULAR | Status: DC | PRN
  Administered 2015-10-28: 0.5 mg via INTRAVENOUS
  Administered 2015-10-28: 1 mg via INTRAVENOUS
  Administered 2015-10-28: 0.5 mg via INTRAVENOUS

## 2015-10-28 MED ORDER — SODIUM CHLORIDE 0.9 % IV SOLN
INTRAVENOUS | Status: DC | PRN
  Administered 2015-10-28: 60 mL

## 2015-10-28 MED ORDER — TAPENTADOL HCL 50 MG PO TABS
50.0000 mg | ORAL_TABLET | Freq: Four times a day (QID) | ORAL | Status: DC
  Administered 2015-10-28 – 2015-10-31 (×11): 50 mg via ORAL
  Filled 2015-10-28 (×11): qty 1

## 2015-10-28 MED ORDER — ONDANSETRON HCL 4 MG/2ML IJ SOLN: 4.0000 mg | Freq: Once | INTRAMUSCULAR | Status: DC | PRN

## 2015-10-28 MED ORDER — FENTANYL CITRATE (PF) 100 MCG/2ML IJ SOLN
INTRAMUSCULAR | Status: AC
  Filled 2015-10-28: qty 2

## 2015-10-28 MED ORDER — ACETAMINOPHEN 10 MG/ML IV SOLN
INTRAVENOUS | Status: AC
  Filled 2015-10-28: qty 100

## 2015-10-28 SURGICAL SUPPLY — 57 items
AUTOTRANSFUS HAS 1/8 (MISCELLANEOUS) ×3
BATTERY INSTRU NAVIGATION (MISCELLANEOUS) ×12 IMPLANT
BLADE SAW 1 (BLADE) ×3 IMPLANT
BLADE SAW 1/2 (BLADE) ×3 IMPLANT
BONE CEMENT GENTAMICIN (Cement) ×3 IMPLANT
CANISTER SUCT 1200ML W/VALVE (MISCELLANEOUS) ×3 IMPLANT
CANISTER SUCT 3000ML (MISCELLANEOUS) ×6 IMPLANT
CAP KNEE TOTAL 3 SIGMA ×3 IMPLANT
CATH TRAY METER 16FR LF (MISCELLANEOUS) ×3 IMPLANT
CEMENT BONE GENTAMICIN 40 (Cement) ×1 IMPLANT
COOLER POLAR GLACIER W/PUMP (MISCELLANEOUS) ×3 IMPLANT
DRAPE SHEET LG 3/4 BI-LAMINATE (DRAPES) ×3 IMPLANT
DRSG DERMACEA 8X12 NADH (GAUZE/BANDAGES/DRESSINGS) ×3 IMPLANT
DRSG OPSITE POSTOP 4X14 (GAUZE/BANDAGES/DRESSINGS) ×3 IMPLANT
DRSG TEGADERM 4X4.75 (GAUZE/BANDAGES/DRESSINGS) ×3 IMPLANT
DURAPREP 26ML APPLICATOR (WOUND CARE) ×6 IMPLANT
ELECT CAUTERY BLADE 6.4 (BLADE) ×3 IMPLANT
EX-PIN ORTHOLOCK NAV 4X150 (PIN) ×6 IMPLANT
GLOVE BIOGEL M STRL SZ7.5 (GLOVE) ×6 IMPLANT
GLOVE INDICATOR 8.0 STRL GRN (GLOVE) ×3 IMPLANT
GLOVE SURG 9.0 ORTHO LTXF (GLOVE) ×3 IMPLANT
GLOVE SURG ORTHO 9.0 STRL STRW (GLOVE) ×3 IMPLANT
GOWN STRL REUS W/ TWL LRG LVL3 (GOWN DISPOSABLE) ×2 IMPLANT
GOWN STRL REUS W/ TWL LRG LVL4 (GOWN DISPOSABLE) ×1 IMPLANT
GOWN STRL REUS W/TWL 2XL LVL3 (GOWN DISPOSABLE) ×3 IMPLANT
GOWN STRL REUS W/TWL LRG LVL3 (GOWN DISPOSABLE) ×4
GOWN STRL REUS W/TWL LRG LVL4 (GOWN DISPOSABLE) ×2
HANDPIECE SUCTION TUBG SURGILV (MISCELLANEOUS) ×3 IMPLANT
HOLDER FOLEY CATH W/STRAP (MISCELLANEOUS) ×3 IMPLANT
HOOD PEEL AWAY FLYTE STAYCOOL (MISCELLANEOUS) ×6 IMPLANT
KIT RM TURNOVER STRD PROC AR (KITS) ×3 IMPLANT
KNIFE SCULPS 14X20 (INSTRUMENTS) ×3 IMPLANT
NDL SAFETY 18GX1.5 (NEEDLE) ×3 IMPLANT
NEEDLE SPNL 20GX3.5 QUINCKE YW (NEEDLE) ×3 IMPLANT
NS IRRIG 500ML POUR BTL (IV SOLUTION) ×3 IMPLANT
PACK TOTAL KNEE (MISCELLANEOUS) ×3 IMPLANT
PAD GROUND ADULT SPLIT (MISCELLANEOUS) ×3 IMPLANT
PAD WRAPON POLAR KNEE (MISCELLANEOUS) ×1 IMPLANT
PIN DRILL QUICK PACK ×3 IMPLANT
PIN FIXATION 1/8DIA X 3INL (PIN) ×3 IMPLANT
SOL .9 NS 3000ML IRR  AL (IV SOLUTION) ×2
SOL .9 NS 3000ML IRR UROMATIC (IV SOLUTION) ×1 IMPLANT
SOL PREP PVP 2OZ (MISCELLANEOUS) ×3
SOLUTION PREP PVP 2OZ (MISCELLANEOUS) ×1 IMPLANT
SPONGE DRAIN TRACH 4X4 STRL 2S (GAUZE/BANDAGES/DRESSINGS) ×3 IMPLANT
STAPLER SKIN PROX 35W (STAPLE) ×3 IMPLANT
SUCTION FRAZIER TIP 10 FR DISP (SUCTIONS) ×3 IMPLANT
SUT VIC AB 0 CT1 36 (SUTURE) ×3 IMPLANT
SUT VIC AB 1 CT1 36 (SUTURE) ×6 IMPLANT
SUT VIC AB 2-0 CT2 27 (SUTURE) ×3 IMPLANT
SYR 20CC LL (SYRINGE) ×3 IMPLANT
SYR 30ML LL (SYRINGE) ×3 IMPLANT
SYR 50ML LL SCALE MARK (SYRINGE) ×3 IMPLANT
SYSTEM AUTOTRANSFUS DUAL TROCR (MISCELLANEOUS) ×1 IMPLANT
TOWEL OR 17X26 4PK STRL BLUE (TOWEL DISPOSABLE) ×3 IMPLANT
TOWER CARTRIDGE SMART MIX (DISPOSABLE) ×3 IMPLANT
WRAPON POLAR PAD KNEE (MISCELLANEOUS) ×3

## 2015-10-28 NOTE — Brief Op Note (Signed)
10/28/2015  3:28 PM  PATIENT:  Carlena HurlMichael R Abramo  59 y.o. male  PRE-OPERATIVE DIAGNOSIS:  DEGENERATIVE OSTEOARTHRITIS of the left knee  POST-OPERATIVE DIAGNOSIS:  Same  PROCEDURE:  Procedure(s): COMPUTER ASSISTED TOTAL KNEE ARTHROPLASTY (Left)  SURGEON:  Surgeon(s) and Role:    * Donato HeinzJames P Parris Signer, MD - Primary  ASSISTANTS: Van ClinesJon Wolfe, PA   ANESTHESIA:   general  EBL:  Total I/O In: 1800 [I.V.:1800] Out: 400 [Urine:300; Blood:100]  BLOOD ADMINISTERED:none  DRAINS: 2 medium drains to a reinfusion system   LOCAL MEDICATIONS USED:  MARCAINE    and OTHER Exparel  SPECIMEN:  No Specimen  DISPOSITION OF SPECIMEN:  N/A  COUNTS:  YES  TOURNIQUET:  92 minutes  DICTATION: .Dragon Dictation  PLAN OF CARE: Admit to inpatient   PATIENT DISPOSITION:  PACU - hemodynamically stable.   Delay start of Pharmacological VTE agent (>24hrs) due to surgical blood loss or risk of bleeding: yes

## 2015-10-28 NOTE — Progress Notes (Signed)
Pt arrived to floor approx 1715. Pain control adequate, O2 applied PRN at 2L due to pain medication. Tolerated clear liquids well. Glasses at bedside brought from OR/PACU. Pt states he is UTD on Flu vaccine. Autovac draining, 90mL output.

## 2015-10-28 NOTE — Anesthesia Procedure Notes (Addendum)
Procedure Name: Intubation Date/Time: 10/28/2015 12:05 PM Performed by: Mathews ArgyleLOGAN, Emoni Yang Pre-anesthesia Checklist: Patient identified, Patient being monitored, Timeout performed, Emergency Drugs available and Suction available Patient Re-evaluated:Patient Re-evaluated prior to inductionOxygen Delivery Method: Circle system utilized Preoxygenation: Pre-oxygenation with 100% oxygen Intubation Type: IV induction and Rapid sequence Ventilation: Mask ventilation without difficulty and Two handed mask ventilation required Laryngoscope Size: Miller, 2, Glidescope and 4 Grade View: Grade II Tube type: Oral Tube size: 8.0 mm Number of attempts: 2 (1st attempt placed with DL but with cuff leak, reintubated with larger tube and glidescope) Airway Equipment and Method: Stylet,  Patient positioned with wedge pillow and Video-laryngoscopy Placement Confirmation: ETT inserted through vocal cords under direct vision,  positive ETCO2 and breath sounds checked- equal and bilateral Secured at: 23 cm Tube secured with: Tape Dental Injury: Teeth and Oropharynx as per pre-operative assessment

## 2015-10-28 NOTE — H&P (Signed)
The patient has been re-examined, and the chart reviewed, and there have been no interval changes to the documented history and physical.    The risks, benefits, and alternatives have been discussed at length. The patient expressed understanding of the risks benefits and agreed with plans for surgical intervention.  Devon Olsen, Jr. M.D.    

## 2015-10-28 NOTE — OR Nursing (Signed)
Patient expressed extreme anxiety due to PTSD from prior surgeries.  Requested something to relax him or he would need to walk out and leave.  Dr. Pernell DupreAdams aware and patient given Versed with good relief of anxiety.

## 2015-10-28 NOTE — Op Note (Signed)
OPERATIVE NOTE  DATE OF SURGERY:  10/28/2015  PATIENT NAME:  Devon Olsen   DOB: 12/29/1955  MRN: [id number]  PRE-OPERATIVE DIAGNOSIS: Degenerative arthrosis of the left knee, primary  POST-OPERATIVE DIAGNOSIS:  Same  PROCEDURE:  Left total knee arthroplasty using computer-assisted navigation  SURGEON:  Jena Gauss. M.D.  ASSISTANT:  Van Clines, PA (present and scrubbed throughout the case, critical for assistance with exposure, retraction, instrumentation, and closure)  ANESTHESIA: general  ESTIMATED BLOOD LOSS: 100 mL  FLUIDS REPLACED: 1800 mL of crystalloid  TOURNIQUET TIME: 92 minutes  DRAINS: 2 medium drains to a reinfusion system  SOFT TISSUE RELEASES: Anterior cruciate ligament, posterior cruciate ligament, deep medial collateral ligament, patellofemoral ligament   IMPLANTS UTILIZED: DePuy PFC Sigma size 5 posterior stabilized femoral component (cemented), size 5 MBT tibial component (cemented), 41 mm 3 peg oval dome patella (cemented), and a 10 mm stabilized rotating platform polyethylene insert.  INDICATIONS FOR SURGERY: Devon Olsen is a 59 y.o. year old male with a long history of progressive knee pain. X-rays demonstrated severe degenerative changes in tricompartmental fashion. The patient had not seen any significant improvement despite conservative nonsurgical intervention. After discussion of the risks and benefits of surgical intervention, the patient expressed understanding of the risks benefits and agree with plans for total knee arthroplasty.   The risks, benefits, and alternatives were discussed at length including but not limited to the risks of infection, bleeding, nerve injury, stiffness, blood clots, the need for revision surgery, cardiopulmonary complications, among others, and they were willing to proceed.  PROCEDURE IN DETAIL: The patient was brought into the operating room and, after adequate general anesthesia was achieved, a tourniquet  was placed on the patient's upper thigh. The patient's knee and leg were cleaned and prepped with alcohol and DuraPrep and draped in the usual sterile fashion. A "timeout" was performed as per usual protocol. The lower extremity was exsanguinated using an Esmarch, and the tourniquet was inflated to 300 mmHg. An anterior longitudinal incision was made followed by a standard mid vastus approach. The deep fibers of the medial collateral ligament were elevated in a subperiosteal fashion off of the medial flare of the tibia so as to maintain a continuous soft tissue sleeve. The patella was subluxed laterally and the patellofemoral ligament was incised. Inspection of the knee demonstrated severe degenerative changes with full-thickness loss of articular cartilage. Osteophytes were debrided using a rongeur. Anterior and posterior cruciate ligaments were excised. Two 4.0 mm Schanz pins were inserted in the femur and into the tibia for attachment of the array of trackers used for computer-assisted navigation. Hip center was identified using a circumduction technique. Distal landmarks were mapped using the computer. The distal femur and proximal tibia were mapped using the computer. The distal femoral cutting guide was positioned using computer-assisted navigation so as to achieve a 5 distal valgus cut. The femur was sized and it was felt that a size 5 femoral component was appropriate. A size 5 femoral cutting guide was positioned and the anterior cut was performed and verified using the computer. This was followed by completion of the posterior and chamfer cuts. Femoral cutting guide for the central box was then positioned in the center box cut was performed.  Attention was then directed to the proximal tibia. Medial and lateral menisci were excised. The extramedullary tibial cutting guide was positioned using computer-assisted navigation so as to achieve a 0 varus-valgus alignment and 0 posterior slope. The cut was  performed and verified  using the computer. The proximal tibia was sized and it was felt that a size 5 tibial tray was appropriate. Tibial and femoral trials were inserted followed by insertion of a 10 mm polyethylene insert. This allowed for excellent mediolateral soft tissue balancing both in flexion and in full extension. Finally, the patella was cut and prepared so as to accommodate a 41 mm 3 peg oval dome patella. A patella trial was placed and the knee was placed through a range of motion with excellent patellar tracking appreciated. The femoral trial was removed after debridement of posterior osteophytes. The central post-hole for the tibial component was reamed followed by insertion of a keel punch. Tibial trials were then removed. Cut surfaces of bone were irrigated with copious amounts of normal saline with antibiotic solution using pulsatile lavage and then suctioned dry. Polymethylmethacrylate cement with gentamicin was prepared in the usual fashion using a vacuum mixer. Cement was applied to the cut surface of the proximal tibia as well as along the undersurface of a size 5 MBT tibial component. Tibial component was positioned and impacted into place. Excess cement was removed using Personal assistantreer elevators. Cement was then applied to the cut surfaces of the femur as well as along the posterior flanges of the size 5 femoral component. The femoral component was positioned and impacted into place. Excess cement was removed using Personal assistantreer elevators. A 10 mm polyethylene trial was inserted and the knee was brought into full extension with steady axial compression applied. Finally, cement was applied to the backside of a 41 mm 3 peg oval dome patella and the patellar component was positioned and patellar clamp applied. Excess cement was removed using Personal assistantreer elevators. After adequate curing of the cement, the tourniquet was deflated after a total tourniquet time of 92 minutes. Hemostasis was achieved using electrocautery.  The knee was irrigated with copious amounts of normal saline with antibiotic solution using pulsatile lavage and then suctioned dry. 20 mL of 1.3% Exparel in 40 mL of normal saline was injected along the posterior capsule, medial and lateral gutters, and along the arthrotomy site. A 10 mm stabilized rotating platform polyethylene insert was inserted and the knee was placed through a range of motion with excellent mediolateral soft tissue balancing appreciated and excellent patellar tracking noted. 2 medium drains were placed in the wound bed and brought out through separate stab incisions to be attached to a reinfusion system. The medial parapatellar portion of the incision was reapproximated using interrupted sutures of #1 Vicryl. Subcutaneous tissue was then injected with a total of 30 cc of 0.25% Marcaine with epinephrine. Subcutaneous tissue was approximated in layers using first #0 Vicryl followed #2-0 Vicryl. The skin was approximated with skin staples. A sterile dressing was applied.  The patient tolerated the procedure well and was transported to the recovery room in stable condition.    Devon Olsen, Jr., M.D.

## 2015-10-28 NOTE — Transfer of Care (Signed)
Immediate Anesthesia Transfer of Care Note  Patient: Devon Olsen  Procedure(s) Performed: Procedure(s): COMPUTER ASSISTED TOTAL KNEE ARTHROPLASTY (Left)  Patient Location: PACU  Anesthesia Type:General  Level of Consciousness: awake, alert  and oriented  Airway & Oxygen Therapy: Patient Spontanous Breathing and Patient connected to face mask oxygen  Post-op Assessment: Report given to RN and Post -op Vital signs reviewed and stable  Post vital signs: Reviewed and stable  Last Vitals:  Filed Vitals:   10/28/15 0950  BP: 139/83  Pulse: 58  Temp: 36.8 C  Resp: 16    Complications: No apparent anesthesia complications

## 2015-10-28 NOTE — Anesthesia Preprocedure Evaluation (Signed)
Anesthesia Evaluation  Patient identified by MRN, date of birth, ID band Patient awake    Reviewed: Allergy & Precautions, H&P , NPO status , Patient's Chart, lab work & pertinent test results, reviewed documented beta blocker date and time   Airway Mallampati: III  TM Distance: >3 FB Neck ROM: full    Dental  (+) Teeth Intact, Poor Dentition   Pulmonary neg pulmonary ROS, Current Smoker, former smoker,    Pulmonary exam normal        Cardiovascular hypertension, negative cardio ROS Normal cardiovascular exam Rhythm:regular Rate:Normal     Neuro/Psych PSYCHIATRIC DISORDERS negative neurological ROS  negative psych ROS   GI/Hepatic negative GI ROS, Neg liver ROS, GERD  ,  Endo/Other  negative endocrine ROS  Renal/GU negative Renal ROS  negative genitourinary   Musculoskeletal   Abdominal   Peds  Hematology negative hematology ROS (+)   Anesthesia Other Findings   Reproductive/Obstetrics negative OB ROS                             Anesthesia Physical Anesthesia Plan  ASA: III  Anesthesia Plan: General ETT   Post-op Pain Management:    Induction:   Airway Management Planned:   Additional Equipment:   Intra-op Plan:   Post-operative Plan:   Informed Consent: I have reviewed the patients History and Physical, chart, labs and discussed the procedure including the risks, benefits and alternatives for the proposed anesthesia with the patient or authorized representative who has indicated his/her understanding and acceptance.     Plan Discussed with: CRNA  Anesthesia Plan Comments:         Anesthesia Quick Evaluation

## 2015-10-29 ENCOUNTER — Encounter: Payer: Self-pay | Admitting: Orthopedic Surgery

## 2015-10-29 LAB — BASIC METABOLIC PANEL
Anion gap: 7 (ref 5–15)
BUN: 15 mg/dL (ref 6–20)
CALCIUM: 8.3 mg/dL — AB (ref 8.9–10.3)
CHLORIDE: 108 mmol/L (ref 101–111)
CO2: 28 mmol/L (ref 22–32)
CREATININE: 0.66 mg/dL (ref 0.61–1.24)
GFR calc non Af Amer: >60 mL/min (ref >60)
GLUCOSE: 105 mg/dL — AB (ref 65–99)
Potassium: 4 mmol/L (ref 3.5–5.1)
Sodium: 143 mmol/L (ref 135–145)

## 2015-10-29 LAB — CBC
HCT: 37.9 % — ABNORMAL LOW (ref 40.0–52.0)
HEMOGLOBIN: 12.4 g/dL — AB (ref 13.0–18.0)
MCH: 28.1 pg (ref 26.0–34.0)
MCHC: 32.7 g/dL (ref 32.0–36.0)
MCV: 86.1 fL (ref 80.0–100.0)
PLATELETS: 129 10*3/uL — AB (ref 150–440)
RBC: 4.4 MIL/uL (ref 4.40–5.90)
RDW: 15.9 % — ABNORMAL HIGH (ref 11.5–14.5)
WBC: 12.5 10*3/uL — AB (ref 3.8–10.6)

## 2015-10-29 MED ORDER — TRAMADOL HCL 50 MG PO TABS: 50.0000 mg | ORAL_TABLET | ORAL | Status: DC | PRN

## 2015-10-29 MED ORDER — OXYCODONE HCL 5 MG PO TABS: 5.0000 mg | ORAL_TABLET | ORAL | Status: DC | PRN

## 2015-10-29 MED ORDER — ENOXAPARIN SODIUM 40 MG/0.4ML ~~LOC~~ SOLN: 40.0000 mg | SUBCUTANEOUS | Status: DC

## 2015-10-29 NOTE — Anesthesia Postprocedure Evaluation (Signed)
Anesthesia Post Note  Patient: Carlena HurlMichael R Boettner  Procedure(s) Performed: Procedure(s) (LRB): COMPUTER ASSISTED TOTAL KNEE ARTHROPLASTY (Left)  Patient location during evaluation: PACU Anesthesia Type: General Level of consciousness: awake and alert Pain management: pain level controlled Vital Signs Assessment: post-procedure vital signs reviewed and stable Respiratory status: spontaneous breathing, nonlabored ventilation, respiratory function stable and patient connected to nasal cannula oxygen Cardiovascular status: blood pressure returned to baseline and stable Postop Assessment: no signs of nausea or vomiting Anesthetic complications: no    Last Vitals:  Filed Vitals:   10/28/15 2046 10/29/15 0452  BP: 128/69 169/85  Pulse: 72 60  Temp: 36.8 C 36.8 C  Resp: 19 18    Last Pain:  Filed Vitals:   10/29/15 0601  PainSc: 2                  Yevette EdwardsJames G Adams

## 2015-10-29 NOTE — Progress Notes (Signed)
Pt. Alert and oriented. VSS. Pain controlled with PO pain meds. Foley patent and will be removed this AM. IS at the bedside and pts. Been encouraged to use it throughout the night. Pt. Dangled at the bedside last night and did well with that. Neurochecks WDL. Heels elevated off of bed with towel rolls. Pt. Rested quietly throughout  The night. Will continue to monitor.

## 2015-10-29 NOTE — Discharge Instructions (Signed)

## 2015-10-29 NOTE — Clinical Social Work Note (Signed)
CSW consulted in the event patient would require STR at discharge. PT has assessed patient today and recommended home health. RN CM to follow. Please reconsult CSW if needed. York SpanielMonica Doneta Bayman MSW,LCSW [phone]

## 2015-10-29 NOTE — Evaluation (Signed)
Physical Therapy Evaluation Patient Details Name: Devon Olsen MRN: [id number] DOB: 1956/06/19 Today's Date: 10/29/2015   History of Present Illness  Pt is a 59 y.o. 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  PMH includes htn, GERD, PTSD.  Clinical Impression  Prior to admission, pt was independent (occasional use of SPC d/t L knee pain).  Pt lives with his wife on main floor of home with level entry.  Currently pt is SBA supine to sit; min assist sit to/from stand with RW; CGA ambulating 20 feet with RW.  Pt able to perform x10 L LE SLR independently and did not need knee immobilizer.  Pt would benefit from skilled PT to address noted impairments and functional limitations.  Recommend pt discharge to home with HHPT and support of family when medically appropriate.     Follow Up Recommendations Home health PT    Equipment Recommendations   (pt to check if he has RW at home)    Recommendations for Other Services       Precautions / Restrictions Precautions Precautions: Fall Restrictions Weight Bearing Restrictions: Yes LLE Weight Bearing: Weight bearing as tolerated      Mobility  Bed Mobility Overal bed mobility: Needs Assistance Bed Mobility: Supine to Sit     Supine to sit: Supervision;HOB elevated        Transfers Overall transfer level: Needs assistance Equipment used: Rolling walker (2 wheeled) Transfers: Sit to/from Stand Sit to Stand: Min assist         General transfer comment: vc's for hand and feet placement required  Ambulation/Gait Ambulation/Gait assistance: Min guard Ambulation Distance (Feet): 20 Feet Assistive device: Rolling walker (2 wheeled)   Gait velocity: decreased   General Gait Details: mild antalgic gait; vc's for walker technique and gait pattern required initially; decreased stance time L LE  Stairs            Wheelchair Mobility    Modified Rankin (Stroke Patients Only)       Balance Overall  balance assessment: Needs assistance Sitting-balance support: Bilateral upper extremity supported;Feet supported Sitting balance-Leahy Scale: Good     Standing balance support: Bilateral upper extremity supported (on RW) Standing balance-Leahy Scale: Good                               Pertinent Vitals/Pain Pain Assessment: 0-10 Pain Score: 6  Pain Location: L knee Pain Descriptors / Indicators: Aching;Sore;Tender;Operative site guarding Pain Intervention(s): Limited activity within patient's tolerance;Monitored during session;Repositioned;RN gave pain meds during session (polar care applied)  Vitals stable and WFL throughout treatment session.    Home Living Family/patient expects to be discharged to:: Private residence Living Arrangements: Spouse/significant other Available Help at Discharge: Family Type of Home: House Home Access: Level entry     Home Layout: Two level;Able to live on main level with bedroom/bathroom (Plans to stay on main level of home.) Home Equipment: Walker - 4 wheels;Cane - single point;Bedside commode (pt may have RW at home as well)      Prior Function Level of Independence: Independent with assistive device(s)         Comments: Pt occasionally using SPC d/t L knee pain.     Hand Dominance        Extremity/Trunk Assessment   Upper Extremity Assessment: Overall WFL for tasks assessed           Lower Extremity Assessment: RLE deficits/detail;LLE deficits/detail RLE Deficits /  Details: ROM and strength WFL LLE Deficits / Details: L hip flexion at least 4/5; L knee flexion/extension at least 3/5 (limited d/t pain); L DF at least 4/5.  Pt able to perform x10 L LE SLR independently.  Cervical / Trunk Assessment: Normal  Communication   Communication: No difficulties  Cognition Arousal/Alertness: Awake/alert Behavior During Therapy: WFL for tasks assessed/performed Overall Cognitive Status: Within Functional Limits for tasks  assessed                      General Comments General comments (skin integrity, edema, etc.): L knee dressings in place; hemovac in place  Nursing cleared pt for participation in physical therapy.  Pt agreeable to PT session.    Exercises Total Joint Exercises Goniometric ROM: L knee extension 13 degrees short of neutral semi-supine; L knee flexion 80 degrees in sitting  Performed semi-supine B LE therapeutic exercise x 10 reps:  Ankle pumps (AROM B LE's); quad sets x3 second holds (AROM B LE's); SAQ's (AROM R; AROM L); heelslides (AROM R; AAROM L), hip abd/adduction (AROM R; AROM L), and SLR (AROM R; AROM L).  Pt required vc's and tactile cues for correct technique with exercises.       Assessment/Plan    PT Assessment Patient needs continued PT services  PT Diagnosis Difficulty walking;Acute pain   PT Problem List Decreased strength;Decreased range of motion;Decreased activity tolerance;Decreased balance;Decreased mobility;Decreased knowledge of use of DME;Decreased knowledge of precautions;Pain  PT Treatment Interventions DME instruction;Gait training;Stair training;Functional mobility training;Therapeutic activities;Therapeutic exercise;Balance training;Patient/family education;Manual techniques   PT Goals (Current goals can be found in the Care Plan section) Acute Rehab PT Goals Patient Stated Goal: to go home PT Goal Formulation: With patient Time For Goal Achievement: 11/12/15 Potential to Achieve Goals: Good    Frequency BID   Barriers to discharge        Co-evaluation               End of Session Equipment Utilized During Treatment: Gait belt Activity Tolerance: Patient limited by pain Patient left: in chair;with call bell/phone within reach;with chair alarm set;with SCD's reapplied (B heels elevated via towel rolls; polar care in place (did not appear cold though; nursing notified)) Nurse Communication: Mobility status;Precautions         Time:  0932-1000 PT Time Calculation (min) (ACUTE ONLY): 28 min   Charges:   PT Evaluation $Initial PT Evaluation Tier I: 1 Procedure PT Treatments $Therapeutic Exercise: 8-22 mins   PT G CodesHendricks Olsen:        Devon Olsen 10/29/2015, 10:21 AM Devon Olsen, PT [phone]

## 2015-10-29 NOTE — Progress Notes (Signed)
Physical Therapy Treatment Patient Details Name: Devon Olsen MRN: [id number] DOB: 07-09-1956 Today's Date: 10/29/2015    History of Present Illness Pt is a 59 y.o. 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  PMH includes htn, GERD, PTSD.    PT Comments    Pt demonstrating improvement in gait distance. Rolling walker adjusted for improved fit/comfort. Heavy lean on rolling walker due to left knee pain, but good reciprocal pattern. Pt wishes back to bed as pt has been in chair all day. Pt placed in gravity assisted left knee extension stretch and encouraged quad set. Pt wishes to hold other exercises at this time due to fatigue and increased pain; pt was medicated during session. Continue PT for progression of strength, active range of motion left knee and all functional mobility to allow for optimal return home post discharge. Noted that pt does not have steps to perform at home.   Follow Up Recommendations  Home health PT     Equipment Recommendations       Recommendations for Other Services       Precautions / Restrictions Precautions Precautions: Fall Restrictions Weight Bearing Restrictions: Yes LLE Weight Bearing: Weight bearing as tolerated    Mobility  Bed Mobility Overal bed mobility: Needs Assistance Bed Mobility: Sit to Supine       Sit to supine: Min assist (for LLE)   General bed mobility comments: very little physical assist required  Transfers Overall transfer level: Needs assistance Equipment used: Rolling walker (2 wheeled) Transfers: Sit to/from Stand Sit to Stand: Min guard (demonstrates good attempt at using LLE as tolerated)            Ambulation/Gait Ambulation/Gait assistance: Min guard Ambulation Distance (Feet): 260 Feet Assistive device: Rolling walker (2 wheeled) Gait Pattern/deviations: Step-through pattern (decreased bend LLE with swing phase primarily due to bandage)   Gait velocity interpretation: Below normal  speed for age/gender General Gait Details: antalgic, but demonstrates good reciprocal pattern and speed without LOB. Adjusted rw for appropriate height   Stairs            Wheelchair Mobility    Modified Rankin (Stroke Patients Only)       Balance Overall balance assessment: No apparent balance deficits (not formally assessed)                                  Cognition Arousal/Alertness: Awake/alert Behavior During Therapy: WFL for tasks assessed/performed Overall Cognitive Status: Within Functional Limits for tasks assessed                      Exercises Total Joint Exercises Quad Sets: Left;20 reps;Supine;Strengthening (gravity assisted stretch to posterior knee)    General Comments        Pertinent Vitals/Pain Pain Assessment: 0-10 Pain Score: 7  Pain Location: L knee Pain Descriptors / Indicators: Aching;Sharp Pain Intervention(s): RN gave pain meds during session    Home Living                      Prior Function            PT Goals (current goals can now be found in the care plan section) Progress towards PT goals: Progressing toward goals    Frequency  BID    PT Plan Current plan remains appropriate    Co-evaluation  End of Session Equipment Utilized During Treatment: Gait belt Activity Tolerance: Patient limited by fatigue;No increased pain Patient left: in bed;with call bell/phone within reach;with bed alarm set;with SCD's reapplied;Other (comment) (polar care applied)     Time: [id number] PT Time Calculation (min) (ACUTE ONLY): 19 min  Charges:  $Gait Training: 8-22 mins                    G Codes:      Kristeen MissHeidi Elizabeth Bishop 10/29/2015, 4:25 PM

## 2015-10-29 NOTE — Progress Notes (Signed)
Foley d/c'd at 0600 with 350cc urine output.

## 2015-10-29 NOTE — Evaluation (Signed)
Occupational Therapy Evaluation Patient Details Name: PRATYUSH AMMON MRN: [id number] DOB: October 29, 1956 Today's Date: 10/29/2015    History of Present Illness Pt is a 59 y.o. 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  PMH includes htn, GERD, PTSD.   Clinical Impression   This patient is a 59 year old male who came to [facility] for a L total knee replacement.  Patient lives in a  home with His wife and  had been independent with ADL and functional mobility with occasional single point cane use. He now demonstrates ability to dress lower body with contact guard assist and verbal cues with out use of assistive devices.  No further Occupational Therapy needed.        Follow Up Recommendations  No OT follow up    Equipment Recommendations       Recommendations for Other Services  (Home with home health PT, no further OT needed.)     Precautions / Restrictions Precautions Precautions: Fall Restrictions Weight Bearing Restrictions: Yes LLE Weight Bearing: Weight bearing as tolerated      Mobility Bed Mobility        Transfers            Balance                              ADL                                         General ADL Comments: Patient had been independent with ADL with occasional single point cane use. Today patient demonstrated ability dress lower body (Donned/doffed socks and pants to knees (drain still in place) with out use of hip kit. He did need verbal cues for safety and technique.     Vision     Perception     Praxis      Pertinent Vitals/Pain Pain Assessment: 0-10 Pain Score: 6  Pain Location: L knee Pain Descriptors / Indicators: Aching;Sore Pain Intervention(s): Limited activity within patient's tolerance;Monitored during session;Repositioned;RN gave pain meds during session (polar care applied)     Hand Dominance     Extremity/Trunk Assessment Upper Extremity  Assessment Upper Extremity Assessment: Overall WFL for tasks assessed       Communication Communication Communication: No difficulties   Cognition Arousal/Alertness: Awake/alert Behavior During Therapy: WFL for tasks assessed/performed Overall Cognitive Status: Within Functional Limits for tasks assessed                     General Comments       Exercises       Shoulder Instructions      Home Living Family/patient expects to be discharged to:: Private residence Living Arrangements: Spouse/significant other Available Help at Discharge: Family Type of Home: House Home Access: Level entry     Home Layout: Two level;Able to live on main level with bedroom/bathroom         Bathroom Toilet: Standard     Home Equipment: Walker - 4 wheels;Cane - single point;Bedside commode          Prior Functioning/Environment Level of Independence: Independent with assistive device(s)        Comments: Occasional single point cane    OT Diagnosis: Acute pain   OT Problem List:     OT Treatment/Interventions:  OT Goals(Current goals can be found in the care plan section) Acute Rehab OT Goals Patient Stated Goal: to go home  OT Frequency:     Barriers to D/C:            Co-evaluation              End of Session    Activity Tolerance:   Patient left: in chair;with call bell/phone within reach;with chair alarm set   Time: 1006-1030 OT Time Calculation (min): 24 min Charges:  OT General Charges $OT Visit: 1 Procedure OT Evaluation $Initial OT Evaluation Tier I: 1 Procedure OT Treatments $Self Care/Home Management : 8-22 mins G-Codes:    Myrene Galas, MS/OTR/L  10/29/2015, 11:37 AM

## 2015-10-29 NOTE — Progress Notes (Addendum)
   Subjective: 1 Day Post-Op Procedure(s) (LRB): COMPUTER ASSISTED TOTAL KNEE ARTHROPLASTY (Left) Patient reports pain as 3 on 0-10 scale.   Patient is well, and has had no acute complaints or problems We will start therapy today.  Plan is to go Home after hospital stay. no nausea and no vomiting Patient denies any chest pains or shortness of breath. Objective: Vital signs in last 24 hours: Temp:  [98.2 F (36.8 C)-99.2 F (37.3 C)] 98.2 F (36.8 C) (12/29 0452) Pulse Rate:  [58-82] 60 (12/29 0452) Resp:  [11-21] 18 (12/29 0452) BP: (128-188)/(69-106) 169/85 mmHg (12/29 0452) SpO2:  [96 %-100 %] 100 % (12/29 0452) Weight:  [129.275 kg (285 lb)] 129.275 kg (285 lb) (12/28 0950) Pt still has original dressing on Heels are non tender and elevated off the bed using rolled towels Intake/Output from previous day: 12/28 0701 - 12/29 0700 In: 3085 [P.O.:100; I.V.:2585; IV Piggyback:400] Out: 4015 [Urine:3725; Drains:190; Blood:100] Intake/Output this shift:     Recent Labs  10/28/15 1007 10/29/15 0422  HGB 14.6 12.4*    Recent Labs  10/28/15 1007 10/29/15 0422  WBC  --  12.5*  RBC  --  4.40  HCT 43.0 37.9*  PLT  --  129*    Recent Labs  10/28/15 1007 10/29/15 0422  NA 143 143  K 3.7 4.0  CL  --  108  CO2  --  28  BUN  --  15  CREATININE  --  0.66  GLUCOSE 85 105*  CALCIUM  --  8.3*   No results for input(s): LABPT, INR in the last 72 hours.  EXAM General - Patient is Alert, Appropriate and Oriented Extremity - Neurologically intact Neurovascular intact Sensation intact distally Intact pulses distally Dorsiflexion/Plantar flexion intact  Lungs - clear Dressing - dressing C/D/I Motor Function - intact, moving foot and toes well on exam.    Past Medical History  Diagnosis Date  . Hypertension   . ADHD (attention deficit hyperactivity disorder)   . ADD (attention deficit disorder)   . GERD (gastroesophageal reflux disease)   . Arthritis      Assessment/Plan: 1 Day Post-Op Procedure(s) (LRB): COMPUTER ASSISTED TOTAL KNEE ARTHROPLASTY (Left) Active Problems:   Left knee DJD   Total knee replacement status  Estimated body mass index is 39.77 kg/(m^2) as calculated from the following:   Height as of this encounter: 5\' 11"  (1.803 m).   Weight as of this encounter: 129.275 kg (285 lb). Advance diet Up with therapy D/C IV fluids Plan for discharge tomorrow Discharge home with home health  Labs: were reviewed DVT Prophylaxis - Lovenox, Foot Pumps and TED hose Weight-Bearing as tolerated to left leg D/C O2 and Pulse OX and try on Room Air Begin working on a bowel movement Labs in am  [employer] FarmJon R. [facility] CenterWolfe PA [facility] 10/29/2015, 7:02 AM   Illene LabradorJames P. Angie FavaHooten, Jr. M.D.

## 2015-10-29 NOTE — Care Management Note (Addendum)
Case Management Note  Patient Details  Name: Devon Olsen MRN: [id number] Date of Birth: 1956-10-20  Subjective/Objective:                  He states he plans to return home with his wife. He states he has a walker and bedside commode available at home to use. His nephew is a physical therapist but he is not sure what agency he works for- he would like to use that agency and will report back to me. He uses [facility] for Rx [phone].   Action/Plan: List of home health agencies left with patient- will follow up. [facility]  [facility]. RNCM will continue to follow.   Expected Discharge Date:                  Expected Discharge Plan:     In-House Referral:     Discharge planning Services  CM Consult  Post Acute Care Choice:  Home Health Choice offered to:  Patient  DME Arranged:    DME Agency:     HH Arranged:    HH Agency:     Status of Service:  In process, will continue to follow  Medicare Important Message Given:    Date Medicare IM Given:    Medicare IM give by:    Date Additional Medicare IM Given:    Additional Medicare Important Message give by:     If discussed at Long Length of Stay Meetings, dates discussed:    Additional Comments:  Collie Siadngela Bryony Kaman, RN 10/29/2015, 11:02 AM

## 2015-10-29 NOTE — Discharge Summary (Signed)
Physician Discharge Summary  Patient ID: Devon Olsen MRN: [id number] DOB/AGE: 01-05-1956 59 y.o.  Admit date: 10/28/2015 Discharge date: 10/29/2016  Admission Diagnoses:  DEGENERATIVE OSTEOARTHRITIS   Discharge Diagnoses: Patient Active Problem List   Diagnosis Date Noted  . 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 Total knee replacement status 10/28/2015    Past Medical History  Diagnosis Date  . Hypertension   . ADHD (attention deficit hyperactivity disorder)   . ADD (attention deficit disorder)   . GERD (gastroesophageal reflux disease)   . Arthritis      Transfusion: Autovac transfusion given the first 6 hours post op   Consultants (if any):  Case management for home health assistance  Discharged Condition: Improved  Hospital Course: Devon Olsen is an 59 y.o. male who was admitted 10/28/2015 with a diagnosis of degenerative arthrosis left knee and went to the operating room on 10/28/2015 and underwent the above named procedures.    Surgeries:Procedure(s): COMPUTER ASSISTED TOTAL KNEE ARTHROPLASTY on 10/28/2015  PRE-OPERATIVE DIAGNOSIS: Degenerative arthrosis of the left knee, primary  POST-OPERATIVE DIAGNOSIS: Same  PROCEDURE: Left total knee arthroplasty using computer-assisted navigation  SURGEON: Jena Gauss. M.D.  ASSISTANT: Van Clines, PA (present and scrubbed throughout the case, critical for assistance with exposure, retraction, instrumentation, and closure)  ANESTHESIA: general  ESTIMATED BLOOD LOSS: 100 mL  FLUIDS REPLACED: 1800 mL of crystalloid  TOURNIQUET TIME: 92 minutes  DRAINS: 2 medium drains to a reinfusion system  SOFT TISSUE RELEASES: Anterior cruciate ligament, posterior cruciate ligament, deep medial collateral ligament, patellofemoral ligament   IMPLANTS UTILIZED: DePuy PFC Sigma size 5 posterior stabilized femoral component (cemented), size 5 MBT tibial component (cemented), 41 mm 3 peg oval dome patella (cemented),  and a 10 mm stabilized rotating platform polyethylene insert.  INDICATIONS FOR SURGERY: Devon Olsen is a 59 y.o. year old male with a long history of progressive knee pain. X-rays demonstrated severe degenerative changes in tricompartmental fashion. The patient had not seen any significant improvement despite conservative nonsurgical intervention. After discussion of the risks and benefits of surgical intervention, the patient expressed understanding of the risks benefits and agree with plans for total knee arthroplasty.   The risks, benefits, and alternatives were discussed at length including but not limited to the risks of infection, bleeding, nerve injury, stiffness, blood clots, the need for revision surgery, cardiopulmonary complications, among others, and they were willing to proceed. Patient tolerated the surgery well. No complications .Patient was taken to PACU where she was stabilized and then transferred to the orthopedic floor.  Patient started on Lovenox 30 q 12 hrs. Foot pumps applied bilaterally at 80 mm hg. Heels elevated off bed with rolled towels. No evidence of DVT. Calves non tender. Negative Homan. Physical therapy started on day #1 for gait training and transfer with OT starting on  day #1 for ADL and assisted devices. Patient has done well with therapy. Ambulated greater than 200 feet upon being discharged. Was able to go up/down 4 step independently without problem  Patient's IV and foley were d/c'd on day # 1 with the hemovac begin d/c'd on day # 2   He was given perioperative antibiotics:  Anti-infectives    Start     Dose/Rate Route Frequency Ordered Stop   10/28/15 1745  clindamycin (CLEOCIN) IVPB 600 mg     600 mg 100 mL/hr over 30 Minutes Intravenous Every 6 hours 10/28/15 1733 10/29/15 1744   10/28/15 1000  clindamycin (CLEOCIN) IVPB 900 mg  Status:  Discontinued     900 mg 100 mL/hr over 30 Minutes Intravenous  Once 10/28/15 0948 10/28/15 1732   10/28/15  0849  clindamycin (CLEOCIN) 900 MG/50ML IVPB    Comments:  Hallaji, Violet Ann: cabinet override      10/28/15 0849 10/28/15 1221    .  He was fitted with AV 1 compression foot pump devices, early ambulation, and TED stocking and instructed on heel pumps for DVT prophylaxis.  He benefited maximally from the hospital stay and there were no complications.    Recent vital signs:  Filed Vitals:   10/28/15 2046 10/29/15 0452  BP: 128/69 169/85  Pulse: 72 60  Temp: 98.3 F (36.8 C) 98.2 F (36.8 C)  Resp: 19 18    Recent laboratory studies:  Lab Results  Component Value Date   HGB 12.4* 10/29/2015   HGB 14.6 10/28/2015   HGB 14.0 10/13/2015   Lab Results  Component Value Date   WBC 12.5* 10/29/2015   PLT 129* 10/29/2015   Lab Results  Component Value Date   INR 1.03 10/13/2015   Lab Results  Component Value Date   NA 143 10/29/2015   K 4.0 10/29/2015   CL 108 10/29/2015   CO2 28 10/29/2015   BUN 15 10/29/2015   CREATININE 0.66 10/29/2015   GLUCOSE 105* 10/29/2015    Discharge Medications:     Medication List    STOP taking these medications        Hydrocodone-Acetaminophen 7.5-300 MG Tabs      TAKE these medications        amphetamine-dextroamphetamine 30 MG 24 hr capsule  Commonly known as:  ADDERALL XR  Take 30 mg by mouth daily.     celecoxib 200 MG capsule  Commonly known as:  CELEBREX  Take 200 mg by mouth daily.     enoxaparin 40 MG/0.4ML injection  Commonly known as:  LOVENOX  Inject 0.4 mLs (40 mg total) into the skin daily.     FLUoxetine 20 MG capsule  Commonly known as:  PROZAC  Take 20 mg by mouth daily.     Iron-Vitamin C 65-125 MG Tabs  Take 1 tablet by mouth daily.     multivitamin with minerals Tabs tablet  Take 1 tablet by mouth daily.     NUCYNTA ER 100 MG Tb12  Generic drug:  Tapentadol HCl  Take 100 mg by mouth every 12 (twelve) hours.     omeprazole 40 MG capsule  Commonly known as:  PRILOSEC  Take 40 mg by mouth  daily.     oxyCODONE 5 MG immediate release tablet  Commonly known as:  Oxy IR/ROXICODONE  Take 1-2 tablets (5-10 mg total) by mouth every 4 (four) hours as needed for severe pain or breakthrough pain.     tamsulosin 0.4 MG Caps capsule  Commonly known as:  FLOMAX  Take 0.4 mg by mouth daily.     traMADol 50 MG tablet  Commonly known as:  ULTRAM  Take 1-2 tablets (50-100 mg total) by mouth every 4 (four) hours as needed for moderate pain.        Diagnostic Studies: Dg Knee Left Port  10/28/2015  CLINICAL DATA:  Status post left knee replacement EXAM: PORTABLE LEFT KNEE - 1-2 VIEW COMPARISON:  None. FINDINGS: Left knee replacement is seen. 2 surgical drains are noted in place. No acute bony abnormality is noted. IMPRESSION: Status post left knee replacement without acute abnormality. Electronically Signed   By: Loraine LericheMark  Lukens M.D.   On: 10/28/2015 16:18    Disposition: 01-Home or Self Care      Discharge Instructions    Diet - low sodium heart healthy    Complete by:  As directed      Increase activity slowly    Complete by:  As directed            Follow-up Information    Follow up with [facility] On 11/12/2015.   Specialty:  Physician Assistant   Why:  At 9:15 AM   Contact information:   [address] [facility] [place] [address] [phone]       Follow up with Donato Heinz, MD On 12/10/2015.   Specialty:  Orthopedic Surgery   Why:  At 9:45 AM   Contact information:   [address] [facility] [place] [address] [phone]        Signed: Tera Partridge. 10/29/2015, 7:14 AM

## 2015-10-30 LAB — CBC
HCT: 40.8 % (ref 40.0–52.0)
Hemoglobin: 13.3 g/dL (ref 13.0–18.0)
MCH: 28.5 pg (ref 26.0–34.0)
MCHC: 32.5 g/dL (ref 32.0–36.0)
MCV: 87.9 fL (ref 80.0–100.0)
PLATELETS: 167 10*3/uL (ref 150–440)
RBC: 4.65 MIL/uL (ref 4.40–5.90)
RDW: 16.4 % — AB (ref 11.5–14.5)
WBC: 11.4 10*3/uL — AB (ref 3.8–10.6)

## 2015-10-30 MED ORDER — LACTULOSE 10 GM/15ML PO SOLN
10.0000 g | Freq: Two times a day (BID) | ORAL | Status: DC | PRN
  Administered 2015-10-30 – 2015-10-31 (×2): 10 g via ORAL
  Filled 2015-10-30 (×2): qty 30

## 2015-10-30 NOTE — Progress Notes (Signed)
Pt reinforcement teaching for wearing thigh hi teds , because pt removes teds and foot pumps teaching for dvt  provided

## 2015-10-30 NOTE — Progress Notes (Signed)
Pts.been awake most of the night. PO pain meds administered with pt. Reporting little relief. Polarcare on and running. Pt. Reports his leg feels as if its swelling under ace wrap. Refuses to wear foot pumps.

## 2015-10-30 NOTE — Progress Notes (Signed)
Physical Therapy Treatment Patient Details Name: Devon Olsen MRN: [id number] DOB: August 10, 1956 Today's Date: 10/30/2015    History of Present Illness Pt is a 59 y.o. 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  PMH includes htn, GERD, PTSD.    PT Comments    Improved pain control this PM, rated at 3-4/10 throughout session.  Tolerating increased gait distance, functional for household ambulation upon discharge. Decreased stability of L LE in periods of modified SLS, closed-chain (standing) activities; will continue to emphasize as appropriate.   Follow Up Recommendations  Home health PT     Equipment Recommendations  Rolling walker with 5" wheels    Recommendations for Other Services       Precautions / Restrictions Precautions Precautions: Fall Restrictions Weight Bearing Restrictions: Yes LLE Weight Bearing: Weight bearing as tolerated    Mobility  Bed Mobility               General bed mobility comments: seated in recliner beginning/end of treatment session  Transfers Overall transfer level: Needs assistance Equipment used: Rolling walker (2 wheeled) Transfers: Sit to/from Stand Sit to Stand: Min guard         General transfer comment: cuing for progressive L knee flexion and increased active use with movement transition  Ambulation/Gait Ambulation/Gait assistance: Min guard Ambulation Distance (Feet): 120 Feet (x2) Assistive device: Rolling walker (2 wheeled)       General Gait Details: mixed step to vs. step through gait pattern; cuing for decreased force of contact R LE (to protect R foot and promote increased stance time L LE).  continues with decreased L TKE in stance, but improved from AM session   Stairs            Wheelchair Mobility    Modified Rankin (Stroke Patients Only)       Balance Overall balance assessment: Needs assistance Sitting-balance support: No upper extremity supported;Feet supported Sitting  balance-Leahy Scale: Good     Standing balance support: Bilateral upper extremity supported Standing balance-Leahy Scale: Fair                      Cognition Arousal/Alertness: Awake/alert Behavior During Therapy: WFL for tasks assessed/performed;Anxious Overall Cognitive Status: Within Functional Limits for tasks assessed                      Exercises Other Exercises Other Exercises: Standing LE therex, 1x10, AROM with RW: heel raises, mini squats (emphasis on symmetrical WBing as able), open chain L LE marching and hip flex/ext/abduct/adduct.  Unable to tolerate modified L SLS for closed-chain standing therex on L LE  Verbally reviewed technique for car transfers upon discharge; patient voiced understanding.    General Comments        Pertinent Vitals/Pain Pain Score: 3  Pain Location: L knee Pain Descriptors / Indicators: Aching Pain Intervention(s): Limited activity within patient's tolerance;Monitored during session;Repositioned;Premedicated before session    Home Living                      Prior Function            PT Goals (current goals can now be found in the care plan section) Acute Rehab PT Goals Patient Stated Goal: to go home PT Goal Formulation: With patient Time For Goal Achievement: 11/12/15 Potential to Achieve Goals: Good Progress towards PT goals: Progressing toward goals    Frequency  BID    PT  Plan Current plan remains appropriate    Co-evaluation             End of Session Equipment Utilized During Treatment: Gait belt Activity Tolerance: Patient tolerated treatment well Patient left: in chair;with call bell/phone within reach (refusing chair alarm)     Time: 1345-1405 PT Time Calculation (min) (ACUTE ONLY): 20 min  Charges:  $Gait Training: 8-22 mins                    G Codes:      Kiasha Bellin H. Manson PasseyBrown, PT, DPT, NCS 10/30/2015, 3:03 PM [phone]

## 2015-10-30 NOTE — Progress Notes (Signed)
Pt. C/o pain 10 out of 10 on pain scale. Dr. Rosita KeaMenz notified and he ordered to give morphine 4mg  x1.

## 2015-10-30 NOTE — Progress Notes (Signed)
Physical Therapy Treatment Patient Details Name: Devon HurlMichael R Olsen MRN: [id number] DOB: 08-12-1956 Today's Date: 10/30/2015    History of Present Illness Pt is a 59 y.o. 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  PMH includes htn, GERD, PTSD.    PT Comments    Significant mobility limitations this date (decreased gait distance, increased assist with transfers) due to pain this AM, rated 6/10 (pre-medicated prior to session).  Patient rather impulsive at times.  Constant encouragement for active use and WBing to L LE with all functional activities.   Follow Up Recommendations  Home health PT     Equipment Recommendations       Recommendations for Other Services       Precautions / Restrictions Precautions Precautions: Fall Restrictions Weight Bearing Restrictions: Yes LLE Weight Bearing: Weight bearing as tolerated    Mobility  Bed Mobility Overal bed mobility: Modified Independent Bed Mobility: Supine to Sit;Sit to Supine     Supine to sit: Modified independent (Device/Increase time) Sit to supine: Modified independent (Device/Increase time)      Transfers Overall transfer level: Needs assistance Equipment used: Rolling walker (2 wheeled) Transfers: Sit to/from Stand Sit to Stand: Min guard;Min assist         General transfer comment: impulsive, very minimal active use of L LE with movement transition (in fact, tends to maintain in complete NWB at times)  Ambulation/Gait Ambulation/Gait assistance: Min assist;Min guard Ambulation Distance (Feet): 100 Feet Assistive device: Rolling walker (2 wheeled)       General Gait Details: step to gait pattern with very limited stance time/WBing L LE; verbal cuing for foot flat and L TKE as tolerated.  Very limited by pain, requiring WC brought to patient for seated rest during gait trial.   Stairs            Wheelchair Mobility    Modified Rankin (Stroke Patients Only)       Balance  Overall balance assessment: No apparent balance deficits (not formally assessed) Sitting-balance support: No upper extremity supported;Feet supported Sitting balance-Leahy Scale: Good     Standing balance support: Bilateral upper extremity supported Standing balance-Leahy Scale: Fair                      Cognition Arousal/Alertness: Awake/alert Behavior During Therapy: WFL for tasks assessed/performed Overall Cognitive Status: Within Functional Limits for tasks assessed                      Exercises Total Joint Exercises Goniometric ROM: L knee extension 8 degrees from neutral in supine; flexion 86 degrees in short-sitting edge of bed Other Exercises Other Exercises: Supine LE therex, 1x12, act vs. act assist ROM for muscular strength/endurance: ankle pumps, quad sets, SAQs, heel slides, hip abduct/adduct and SLR.  Decreased quad activation compared to initial evaluation; limited by pain.    General Comments        Pertinent Vitals/Pain Pain Assessment: 0-10 Pain Score: 6  Pain Location: L knee Pain Descriptors / Indicators: Aching Pain Intervention(s): Repositioned;Premedicated before session;Monitored during session;Limited activity within patient's tolerance    Home Living                      Prior Function            PT Goals (current goals can now be found in the care plan section) Acute Rehab PT Goals Patient Stated Goal: to go home PT Goal Formulation:  With patient Time For Goal Achievement: 11/12/15 Potential to Achieve Goals: Good Progress towards PT goals: Not progressing toward goals - comment (limited by pain at this time)    Frequency  BID    PT Plan Current plan remains appropriate    Co-evaluation             End of Session Equipment Utilized During Treatment: Gait belt Activity Tolerance: Patient limited by pain Patient left: in bed;with call bell/phone within reach;with bed alarm set (patient declined OOB to  chair this AM, states he will have nursing assist with OOB after a rest period)     Time: 5188-4166 PT Time Calculation (min) (ACUTE ONLY): 24 min  Charges:  $Gait Training: 8-22 mins $Therapeutic Exercise: 8-22 mins                    G Codes:      Aritha Huckeba H. Manson Passey, PT, DPT, NCS 10/30/2015, 10:00 AM [phone]

## 2015-10-30 NOTE — Care Management Note (Signed)
Case Management Note  Patient Details  Name: Devon Olsen MRN: [id number] Date of Birth: 16-Sep-1956  Subjective/Objective:     [facility] RX: Lovenox /$100. Spoke with patient. He denies issues paying copay. [facility] chooses [facility] PT. [facility]                Action/Plan:   Expected Discharge Date:                  Expected Discharge Plan:     In-House Referral:     Discharge planning Services  CM Consult  Post Acute Care Choice:  Home Health Choice offered to:  Patient  DME Arranged:    DME Agency:     HH Arranged:    HH Agency:     Status of Service:  In process, will continue to follow  Medicare Important Message Given:    Date Medicare IM Given:    Medicare IM give by:    Date Additional Medicare IM Given:    Additional Medicare Important Message give by:     If discussed at Long Length of Stay Meetings, dates discussed:    Additional Comments:  Marily MemosLisa M Tramayne Sebesta, RN 10/30/2015, 11:35 AM

## 2015-10-30 NOTE — Progress Notes (Signed)
   Subjective: 2 Days Post-Op Procedure(s) (LRB): COMPUTER ASSISTED TOTAL KNEE ARTHROPLASTY (Left) Patient reports pain as severe.   Patient is well, and has had no acute complaints or problems Continue with physical  therapy today.  Plan is to go Home after hospital stay. no nausea and no vomiting Patient denies any chest pains or shortness of breath. Complaining of increase pain since noon time yesterday." My pain medicine is not touching the pain" Refusing heel pumps. Objective: Vital signs in last 24 hours: Temp:  [98.1 F (36.7 C)-98.7 F (37.1 C)] 98.4 F (36.9 C) (12/30 0416) Pulse Rate:  [71-82] 82 (12/30 0416) Resp:  [16-18] 16 (12/30 0416) BP: (127-166)/(68-85) 166/84 mmHg (12/30 0416) SpO2:  [94 %-100 %] 97 % (12/30 0416) well approximated incision Heels are non tender and elevated off the bed using rolled towels Intake/Output from previous day: 12/29 0701 - 12/30 0700 In: 936.7 [P.O.:600; I.V.:336.7] Out: 2350 [Urine:2350] Intake/Output this shift: Total I/O In: -  Out: 1650 [Urine:1650]   Recent Labs  10/28/15 1007 10/29/15 0422  HGB 14.6 12.4*    Recent Labs  10/28/15 1007 10/29/15 0422  WBC  --  12.5*  RBC  --  4.40  HCT 43.0 37.9*  PLT  --  129*    Recent Labs  10/28/15 1007 10/29/15 0422  NA 143 143  K 3.7 4.0  CL  --  108  CO2  --  28  BUN  --  15  CREATININE  --  0.66  GLUCOSE 85 105*  CALCIUM  --  8.3*   No results for input(s): LABPT, INR in the last 72 hours.  EXAM General - Patient is Alert, Appropriate and Oriented Extremity - Neurologically intact Neurovascular intact Sensation intact distally Intact pulses distally Dorsiflexion/Plantar flexion intact Dressing - dressing C/D/I and alot of bleeding at hemovac site Motor Function - intact, moving foot and toes well on exam.   Pt laying with leg flex and externally rotated.  Past Medical History  Diagnosis Date  . Hypertension   . ADHD (attention deficit  hyperactivity disorder)   . ADD (attention deficit disorder)   . GERD (gastroesophageal reflux disease)   . Arthritis     Assessment/Plan: 2 Days Post-Op Procedure(s) (LRB): COMPUTER ASSISTED TOTAL KNEE ARTHROPLASTY (Left) Active Problems:   Left knee DJD   Total knee replacement status  Estimated body mass index is 39.77 kg/(m^2) as calculated from the following:   Height as of this encounter: 5\' 11"  (1.803 m).   Weight as of this encounter: 129.275 kg (285 lb). Up with therapy Discharge home with home health possibly this afternoon if pain under control and pt has a bowel movement. If not tomorrow morning   Labs: pending DVT Prophylaxis - Lovenox, Foot Pumps and TED hose Weight-Bearing as tolerated to left leg hemovac discontinued   [facility] PA [facility] 10/30/2015, 6:48 AM

## 2015-10-31 LAB — CBC
HEMATOCRIT: 38.8 % — AB (ref 40.0–52.0)
HEMOGLOBIN: 12.8 g/dL — AB (ref 13.0–18.0)
MCH: 28.4 pg (ref 26.0–34.0)
MCHC: 33.1 g/dL (ref 32.0–36.0)
MCV: 85.9 fL (ref 80.0–100.0)
Platelets: 181 10*3/uL (ref 150–440)
RBC: 4.51 MIL/uL (ref 4.40–5.90)
RDW: 16.2 % — ABNORMAL HIGH (ref 11.5–14.5)
WBC: 10.4 10*3/uL (ref 3.8–10.6)

## 2015-10-31 NOTE — Care Management Note (Signed)
Case Management Note  Patient Details  Name: Carlena HurlMichael R Scheibe MRN: [id number] Date of Birth: Mar 23, 1956  Subjective/Objective:     [facility]. [name] already has a walker and BSC.                Action/Plan:   Expected Discharge Date:                  Expected Discharge Plan:     In-House Referral:     Discharge planning Services  CM Consult  Post Acute Care Choice:  Home Health Choice offered to:  Patient  DME Arranged:    DME Agency:     HH Arranged:    HH Agency:     Status of Service:  In process, will continue to follow  Medicare Important Message Given:    Date Medicare IM Given:    Medicare IM give by:    Date Additional Medicare IM Given:    Additional Medicare Important Message give by:     If discussed at Long Length of Stay Meetings, dates discussed:    Additional Comments:  Sevilla Murtagh A, RN 10/31/2015, 8:32 AM

## 2015-10-31 NOTE — Progress Notes (Signed)
   Subjective: 3 Days Post-Op Procedure(s) (LRB): COMPUTER ASSISTED TOTAL KNEE ARTHROPLASTY (Left) Patient reports pain as moderate.  Pain much better today than yesterday. Patient is well, and has had no acute complaints or problems Continue with physical therapy today.  Plan is to go Home after hospital stay. no nausea and no vomiting Patient denies any chest pains or shortness of breath. Objective: Vital signs in last 24 hours: Temp:  [98.2 F (36.8 C)-99 F (37.2 C)] 98.9 F (37.2 C) (12/31 0806) Pulse Rate:  [74-83] 80 (12/31 0806) Resp:  [18-20] 18 (12/31 0806) BP: (132-162)/(69-84) 151/74 mmHg (12/31 0806) SpO2:  [94 %-100 %] 100 % (12/31 0806) well approximated incision Heels are non tender and elevated off the bed using rolled towels Intake/Output from previous day: 12/30 0701 - 12/31 0700 In: 1360 [P.O.:1360] Out: 1600 [Urine:1600] Intake/Output this shift: Total I/O In: -  Out: 200 [Urine:200]   Recent Labs  10/28/15 1007 10/29/15 0422 10/30/15 0649 10/31/15 0318  HGB 14.6 12.4* 13.3 12.8*    Recent Labs  10/30/15 0649 10/31/15 0318  WBC 11.4* 10.4  RBC 4.65 4.51  HCT 40.8 38.8*  PLT 167 181    Recent Labs  10/28/15 1007 10/29/15 0422  NA 143 143  K 3.7 4.0  CL  --  108  CO2  --  28  BUN  --  15  CREATININE  --  0.66  GLUCOSE 85 105*  CALCIUM  --  8.3*   No results for input(s): LABPT, INR in the last 72 hours.  EXAM General - Patient is Alert, Appropriate and Oriented Extremity - Neurologically intact Neurovascular intact Sensation intact distally Intact pulses distally Dorsiflexion/Plantar flexion intact Dressing - scant drainage Motor Function - intact, moving foot and toes well on exam.    Past Medical History  Diagnosis Date  . Hypertension   . ADHD (attention deficit hyperactivity disorder)   . ADD (attention deficit disorder)   . GERD (gastroesophageal reflux disease)   . Arthritis     Assessment/Plan: 3 Days  Post-Op Procedure(s) (LRB): COMPUTER ASSISTED TOTAL KNEE ARTHROPLASTY (Left) Active Problems:   Left knee DJD   Total knee replacement status  Estimated body mass index is 39.77 kg/(m^2) as calculated from the following:   Height as of this encounter: 5\' 11"  (1.803 m).   Weight as of this encounter: 129.275 kg (285 lb). Up with therapy Discharge home with home health today after patient has a bowel movement  Labs: Reviewed DVT Prophylaxis - Lovenox, Foot Pumps and TED hose Weight-Bearing as tolerated to left leg Patient needs to have a bowel movement prior to being discharged  Jon R. [facility] PA [facility] 10/31/2015, 8:28 AM

## 2015-10-31 NOTE — Progress Notes (Signed)
Pt up in chair ,medicated for pain, waiting for delivery of walker

## 2015-10-31 NOTE — Care Management Note (Signed)
Case Management Note  Patient Details  Name: Devon Olsen MRN: [id number] Date of Birth: 09/11/1956  Subjective/Objective:      A bariatric rolling walker has not been delivered to Devon Olsen hospital room. Devon Olsen does not want to wait for it and requested that the order be discontinued. Call to [facility] to update them.               Action/Plan:   Expected Discharge Date:                  Expected Discharge Plan:     In-House Referral:     Discharge planning Services  CM Consult  Post Acute Care Choice:  Home Health Choice offered to:  Patient  DME Arranged:    DME Agency:     HH Arranged:    HH Agency:     Status of Service:  In process, will continue to follow  Medicare Important Message Given:    Date Medicare IM Given:    Medicare IM give by:    Date Additional Medicare IM Given:    Additional Medicare Important Message give by:     If discussed at Long Length of Stay Meetings, dates discussed:    Additional Comments:  Devon Olsen A, RN 10/31/2015, 3:41 PM

## 2015-10-31 NOTE — Progress Notes (Signed)
Physical Therapy Treatment Patient Details Name: Devon HurlMichael R Dimaano MRN: [id number] DOB: 06-21-1956 Today's Date: 10/31/2015    History of Present Illness Pt is a 59 y.o. 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  PMH includes htn, GERD, PTSD.    PT Comments    Pt was assisted to complete there ex, gait on RW and stair climbing to ensure he is prepared for home.  He is getting up to move with minimal help and will have help at home as planned.  HHPT to follow up and will hopefully progress to outpatient work as needed.  Follow Up Recommendations  Home health PT     Equipment Recommendations  Rolling walker with 5" wheels    Recommendations for Other Services Rehab consult     Precautions / Restrictions Precautions Precautions: Fall Restrictions Weight Bearing Restrictions: Yes LLE Weight Bearing: Weight bearing as tolerated    Mobility  Bed Mobility Overal bed mobility: Modified Independent Bed Mobility: Supine to Sit     Supine to sit: Modified independent (Device/Increase time)     General bed mobility comments: added mild elevation to HOB at the time pt got up with good response  Transfers Overall transfer level: Modified independent Equipment used: Rolling walker (2 wheeled) Transfers: Sit to/from RaytheonStand;Stand Pivot Transfers Sit to Stand: Min guard (for safety) Stand pivot transfers: Min guard (for safety)       General transfer comment: reminders for hand placement and sequence  Ambulation/Gait Ambulation/Gait assistance: Min guard;Min assist Ambulation Distance (Feet): 150 Feet Assistive device: Rolling walker (2 wheeled) Gait Pattern/deviations: Decreased stride length;Wide base of support;Trunk flexed;Antalgic Gait velocity: decreased Gait velocity interpretation: Below normal speed for age/gender General Gait Details: limited WBing on TKA with pt demonstrating flexed hip position to compensate.   Stairs Stairs: Yes Stairs  assistance: Min guard Stair Management: Two rails;Step to pattern;Forwards Number of Stairs: 8 General stair comments: Pt forgot down pattern but quickly corrected himself with reminders from PT  Wheelchair Mobility    Modified Rankin (Stroke Patients Only)       Balance Overall balance assessment: Needs assistance Sitting-balance support: Feet supported Sitting balance-Leahy Scale: Good     Standing balance support: Bilateral upper extremity supported Standing balance-Leahy Scale: Fair Standing balance comment: fair- dynamic standing                    Cognition Arousal/Alertness: Awake/alert Behavior During Therapy: WFL for tasks assessed/performed Overall Cognitive Status: Within Functional Limits for tasks assessed                      Exercises Total Joint Exercises Ankle Circles/Pumps: AROM;Both;5 reps Heel Slides: Strengthening;Both;10 reps Long Arc Quad: Strengthening;Both;10 reps Goniometric ROM: -5 deg ext    General Comments General comments (skin integrity, edema, etc.): Pt has been instructed on sequence for stairs      Pertinent Vitals/Pain Pain Assessment: 0-10 Pain Score: 4  Pain Location: L knee Pain Descriptors / Indicators: Aching Pain Intervention(s): Monitored during session;Premedicated before session;Repositioned;Ice applied    Home Living                      Prior Function            PT Goals (current goals can now be found in the care plan section) Acute Rehab PT Goals Patient Stated Goal: to go home Progress towards PT goals: Progressing toward goals    Frequency  BID  PT Plan Current plan remains appropriate    Co-evaluation             End of Session Equipment Utilized During Treatment: Gait belt Activity Tolerance: Patient tolerated treatment well Patient left: in chair;with call bell/phone within reach;with nursing/sitter in room     Time: 0922-0950 PT Time Calculation (min) (ACUTE  ONLY): 28 min  Charges:  $Gait Training: 8-22 mins $Therapeutic Exercise: 8-22 mins                    G Codes:      Ivar Drape November 25, 2015, 4:01 PM   Samul Dada, PT MS Acute Rehab Dept. Number: [facility] [id number] and [facility] [phone]

## 2015-10-31 NOTE — Progress Notes (Signed)
POD 3 LTK. Patient pain controlled with scheduled pain medication. Patient resting between care. No acute distress noted.

## 2015-10-31 NOTE — Care Management Note (Signed)
Case Management Note  Patient Details  Name: Devon HurlMichael R Olsen MRN: [id number] Date of Birth: 1955/11/30  Subjective/Objective:        A bariatric rolling walker was ordered from [facility] to be delivered to [name] room today.             Action/Plan:   Expected Discharge Date:                  Expected Discharge Plan:     In-House Referral:     Discharge planning Services  CM Consult  Post Acute Care Choice:  Home Health Choice offered to:  Patient  DME Arranged:    DME Agency:     HH Arranged:    HH Agency:     Status of Service:  In process, will continue to follow  Medicare Important Message Given:    Date Medicare IM Given:    Medicare IM give by:    Date Additional Medicare IM Given:    Additional Medicare Important Message give by:     If discussed at Long Length of Stay Meetings, dates discussed:    Additional Comments:  Taelynn Mcelhannon A, RN 10/31/2015, 11:16 AM

## 2016-10-02 ENCOUNTER — Emergency Department: Payer: BC Managed Care – PPO

## 2016-10-02 ENCOUNTER — Encounter: Payer: Self-pay | Admitting: Emergency Medicine

## 2016-10-02 ENCOUNTER — Emergency Department
Admission: EM | Admit: 2016-10-02 | Discharge: 2016-10-02 | Disposition: A | Discharge status: Home / Self Care | Payer: BC Managed Care – PPO | Attending: Emergency Medicine | Admitting: Emergency Medicine

## 2016-10-02 DIAGNOSIS — Z87891 Personal history of nicotine dependence: Secondary | ICD-10-CM | POA: Insufficient documentation

## 2016-10-02 DIAGNOSIS — Y929 Unspecified place or not applicable: Secondary | ICD-10-CM | POA: Diagnosis not present

## 2016-10-02 DIAGNOSIS — I10 Essential (primary) hypertension: Secondary | ICD-10-CM | POA: Insufficient documentation

## 2016-10-02 DIAGNOSIS — W01198A Fall on same level from slipping, tripping and stumbling with subsequent striking against other object, initial encounter: Secondary | ICD-10-CM | POA: Diagnosis not present

## 2016-10-02 DIAGNOSIS — S52612A Displaced fracture of left ulna styloid process, initial encounter for closed fracture: Secondary | ICD-10-CM | POA: Diagnosis not present

## 2016-10-02 DIAGNOSIS — S52572A Other intraarticular fracture of lower end of left radius, initial encounter for closed fracture: Secondary | ICD-10-CM

## 2016-10-02 DIAGNOSIS — Y939 Activity, unspecified: Secondary | ICD-10-CM | POA: Diagnosis not present

## 2016-10-02 DIAGNOSIS — Y999 Unspecified external cause status: Secondary | ICD-10-CM | POA: Diagnosis not present

## 2016-10-02 DIAGNOSIS — S6992XA Unspecified injury of left wrist, hand and finger(s), initial encounter: Secondary | ICD-10-CM | POA: Diagnosis present

## 2016-10-02 DIAGNOSIS — Z79899 Other long term (current) drug therapy: Secondary | ICD-10-CM | POA: Insufficient documentation

## 2016-10-02 DIAGNOSIS — F909 Attention-deficit hyperactivity disorder, unspecified type: Secondary | ICD-10-CM | POA: Diagnosis not present

## 2016-10-02 MED ORDER — HYDROCODONE-ACETAMINOPHEN 5-325 MG PO TABS
1.0000 | ORAL_TABLET | ORAL | Status: AC
  Administered 2016-10-02: 1 via ORAL
  Filled 2016-10-02: qty 1

## 2016-10-02 MED ORDER — HYDROCODONE-ACETAMINOPHEN 5-325 MG PO TABS: 1.0000 | ORAL_TABLET | ORAL | 0 refills | Status: DC | PRN

## 2016-10-02 NOTE — ED Triage Notes (Signed)
Pt states slipped in yard about 1730 injuring left wrist. Pt denies other injuries, cms intact to left wrist. Ice applied in triage.

## 2016-10-02 NOTE — ED Provider Notes (Signed)
[facility]-EMERGENCY DEPARTMENT Provider Note   CSN: [id number] Arrival date & time: 10/02/16  1900     History   Chief Complaint Chief Complaint  Patient presents with  . Wrist Injury    HPI Devon Olsen is a 60 y.o. male presents to the emergency department for evaluation of left wrist pain. Patient fell just prior to arrival, tripped fell on his left outstretched hand hitting his ulnar styloid against a wheelbarrow. He denies any numbness or tingling throughout the digits. His pain is mostly along the ulnar styloid and distal radius. He denies any other injury throughout the upper extremity. His pain is 3 out of 10. He has not taken any medications for pain.  HPI  Past Medical History:  Diagnosis Date  . ADD (attention deficit disorder)   . ADHD (attention deficit hyperactivity disorder)   . Arthritis   . GERD (gastroesophageal reflux disease)   . Hypertension     Patient Active Problem List   Diagnosis Date Noted  . 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 CHOLECYSTECTOMY    . GASTRIC BYPASS     [facility]  . JOINT REPLACEMENT Right     Total Knee Replacement  . KNEE ARTHROPLASTY Left 10/28/2015   Procedure: COMPUTER ASSISTED TOTAL KNEE ARTHROPLASTY;  Surgeon: Donato Heinz, MD;  Location: [facility] ORS;  Service: Orthopedics;  Laterality: Left;  . KNEE SURGERY    . SHOULDER ARTHROSCOPY WITH SUBACROMIAL DECOMPRESSION Right    [facility] Dr. Ernest Pine  . TONSILLECTOMY         Home Medications    Prior to Admission medications   Medication Sig Start Date End Date Taking? Authorizing Provider  amphetamine-dextroamphetamine (ADDERALL XR) 30 MG 24 hr capsule Take 30 mg by mouth daily.    Historical Provider, MD  celecoxib (CELEBREX) 200 MG capsule Take 200 mg by mouth daily.    Historical Provider, MD  enoxaparin (LOVENOX) 40 MG/0.4ML injection Inject 0.4 mLs (40 mg total) into the  skin daily. 10/29/15   Tera Partridge, PA  FLUoxetine (PROZAC) 20 MG capsule Take 20 mg by mouth daily.    Historical Provider, MD  HYDROcodone-acetaminophen (NORCO) 5-325 MG tablet Take 1 tablet by mouth every 4 (four) hours as needed for moderate pain. 10/02/16   Evon Slack, PA-C  Iron-Vitamin C 65-125 MG TABS Take 1 tablet by mouth daily.    Historical Provider, MD  Multiple Vitamin (MULTIVITAMIN WITH MINERALS) TABS tablet Take 1 tablet by mouth daily.    Historical Provider, MD  omeprazole (PRILOSEC) 40 MG capsule Take 40 mg by mouth daily.    Historical Provider, MD  oxyCODONE (OXY IR/ROXICODONE) 5 MG immediate release tablet Take 1-2 tablets (5-10 mg total) by mouth every 4 (four) hours as needed for severe pain or breakthrough pain. 10/29/15   Tera Partridge, PA  tamsulosin (FLOMAX) 0.4 MG CAPS capsule Take 0.4 mg by mouth daily.    Historical Provider, MD  Tapentadol HCl (NUCYNTA ER) 100 MG TB12 Take 100 mg by mouth every 12 (twelve) hours.    Historical Provider, MD  traMADol (ULTRAM) 50 MG tablet Take 1-2 tablets (50-100 mg total) by mouth every 4 (four) hours as needed for moderate pain. 10/29/15   Tera Partridge, PA    Family History No family history on file.  Social History Social History  Substance Use Topics  . Smoking status: Former  Smoker    Packs/day: 1.00    Types: Cigarettes    Quit date: 01/30/1984  . Smokeless tobacco: Never Used  . Alcohol use No     Allergies   Penicillins   Review of Systems Review of Systems  Respiratory: Negative for shortness of breath.   Cardiovascular: Negative for chest pain.  Musculoskeletal: Positive for arthralgias and joint swelling. Negative for back pain, gait problem and neck pain.  Neurological: Negative for dizziness, numbness and headaches.     Physical Exam Updated Vital Signs BP (!) 152/88   Pulse 70   Resp 16   Ht 5\' 11"  (1.803 m)   Wt (!) 136.2 kg   SpO2 100%   BMI 41.89 kg/m   Physical Exam  Constitutional:  He appears well-developed and well-nourished.  HENT:  Head: Normocephalic and atraumatic.  Eyes: Conjunctivae are normal.  Neck: Neck supple.  Cardiovascular: Normal rate and regular rhythm.   No murmur heard. Pulmonary/Chest: Effort normal. No respiratory distress.  Musculoskeletal:  Examination of the left wrist shows patient has diffuse swelling along the radial carpal joint. There is no ecchymosis or skin breakdown noted. He is tender along the radial and ulnar styloids. He has full composite fist. Sensation is intact throughout the digits. He has good range of motion of the elbow with no discomfort.  Neurological: He is alert.  Skin: Skin is warm and dry.  Psychiatric: He has a normal mood and affect.  Nursing note and vitals reviewed.    ED Treatments / Results  Labs (all labs ordered are listed, but only abnormal results are displayed) Labs Reviewed - No data to display  EKG  EKG Interpretation None       Radiology Dg Wrist Complete Left  Result Date: 10/02/2016 CLINICAL DATA:  Fall onto wrist today. Wrist pain. Initial encounter. EXAM: LEFT WRIST - COMPLETE 3+ VIEW COMPARISON:  None. FINDINGS: A comminuted fracture of the distal radial metaphysis is seen with extension into the distal radial ulnar and radiocarpal joints. There is mild to moderate dorsal angulation of the distal articular surface the radius. Mildly displaced fracture through the ulnar styloid process also noted. Carpal bones remain normal in appearance. IMPRESSION: Comminuted distal radial metaphyseal fracture, with mild to moderate dorsal angulation. Ulnar styloid process fracture. Electronically Signed   By: Myles RosenthalJohn  Stahl M.D.   On: 10/02/2016 20:02    Procedures Procedures (including critical care time) SPLINT APPLICATION Date/Time: 8:09 PM Authorized by: Patience MuscaGAINES, THOMAS CHRISTOPHER Consent: Verbal consent obtained. Risks and benefits: risks, benefits and alternatives were discussed Consent given by:  patient Splint applied by: ED tech Location details: left wrist Splint type: volar Supplies used: sling ortho glass ace wrap Post-procedure: The splinted body part was neurovascularly unchanged following the procedure. Patient tolerance: Patient tolerated the procedure well with no immediate complications.     Medications Ordered in ED Medications  HYDROcodone-acetaminophen (NORCO/VICODIN) 5-325 MG per tablet 1 tablet (1 tablet Oral Given 10/02/16 2000)     Initial Impression / Assessment and Plan / ED Course  I have reviewed the triage vital signs and the nursing notes.  Pertinent labs & imaging results that were available during my care of the patient were reviewed by me and considered in my medical decision making (see chart for details).  Clinical Course     60 year old male with comminuted distal radius fracture with displacement. No numbness or tingling, neurovascularly intact. He is placed into a splint and given a sling and pain medication. He will  call orthopedics tomorrow morning to schedule follow-up appointment. He is educated on signs and symptoms to return to the emergency department for.  Final Clinical Impressions(s) / ED Diagnoses   Final diagnoses:  Other closed intra-articular fracture of distal end of left radius, initial encounter  Closed displaced fracture of styloid process of left ulna, initial encounter    New Prescriptions New Prescriptions   HYDROCODONE-ACETAMINOPHEN (NORCO) 5-325 MG TABLET    Take 1 tablet by mouth every 4 (four) hours as needed for moderate pain.     Evon Slackhomas C Gaines, PA-C 10/02/16 2011    Sharman CheekPhillip Stafford, MD 10/06/16 [phone]

## 2016-10-02 NOTE — Discharge Instructions (Signed)
Please call orthopedics tomorrow to schedule follow-up appointment. Wear splint, keep clean and dry. Keep left hand elevated. Work on moving the digits of the hand. Use sling as needed for comfort. Take Norco as needed for severe pain.

## 2016-10-04 ENCOUNTER — Ambulatory Visit: Payer: BC Managed Care – PPO | Admitting: Anesthesiology

## 2016-10-04 ENCOUNTER — Encounter: Payer: Self-pay | Admitting: *Deleted

## 2016-10-04 ENCOUNTER — Ambulatory Visit
Admission: RE | Admit: 2016-10-04 | Discharge: 2016-10-04 | Disposition: A | Discharge status: Home / Self Care | Payer: BC Managed Care – PPO | Source: Ambulatory Visit | Attending: Orthopedic Surgery | Admitting: Orthopedic Surgery

## 2016-10-04 ENCOUNTER — Ambulatory Visit: Payer: BC Managed Care – PPO

## 2016-10-04 ENCOUNTER — Encounter
Admission: RE | Disposition: A | Discharge status: Home / Self Care | Payer: Self-pay | Source: Ambulatory Visit | Attending: Orthopedic Surgery

## 2016-10-04 DIAGNOSIS — Z791 Long term (current) use of non-steroidal anti-inflammatories (NSAID): Secondary | ICD-10-CM | POA: Insufficient documentation

## 2016-10-04 DIAGNOSIS — K219 Gastro-esophageal reflux disease without esophagitis: Secondary | ICD-10-CM | POA: Insufficient documentation

## 2016-10-04 DIAGNOSIS — Z87892 Personal history of anaphylaxis: Secondary | ICD-10-CM | POA: Insufficient documentation

## 2016-10-04 DIAGNOSIS — Z79891 Long term (current) use of opiate analgesic: Secondary | ICD-10-CM | POA: Insufficient documentation

## 2016-10-04 DIAGNOSIS — W010XXA Fall on same level from slipping, tripping and stumbling without subsequent striking against object, initial encounter: Secondary | ICD-10-CM | POA: Insufficient documentation

## 2016-10-04 DIAGNOSIS — I10 Essential (primary) hypertension: Secondary | ICD-10-CM | POA: Insufficient documentation

## 2016-10-04 DIAGNOSIS — Z9884 Bariatric surgery status: Secondary | ICD-10-CM | POA: Diagnosis not present

## 2016-10-04 DIAGNOSIS — Z8781 Personal history of (healed) traumatic fracture: Secondary | ICD-10-CM

## 2016-10-04 DIAGNOSIS — F909 Attention-deficit hyperactivity disorder, unspecified type: Secondary | ICD-10-CM | POA: Diagnosis not present

## 2016-10-04 DIAGNOSIS — Z8249 Family history of ischemic heart disease and other diseases of the circulatory system: Secondary | ICD-10-CM | POA: Diagnosis not present

## 2016-10-04 DIAGNOSIS — S52612A Displaced fracture of left ulna styloid process, initial encounter for closed fracture: Secondary | ICD-10-CM | POA: Insufficient documentation

## 2016-10-04 DIAGNOSIS — M1990 Unspecified osteoarthritis, unspecified site: Secondary | ICD-10-CM | POA: Insufficient documentation

## 2016-10-04 DIAGNOSIS — Z79899 Other long term (current) drug therapy: Secondary | ICD-10-CM | POA: Diagnosis not present

## 2016-10-04 DIAGNOSIS — Z8619 Personal history of other infectious and parasitic diseases: Secondary | ICD-10-CM | POA: Insufficient documentation

## 2016-10-04 DIAGNOSIS — Z880 Allergy status to penicillin: Secondary | ICD-10-CM | POA: Diagnosis not present

## 2016-10-04 DIAGNOSIS — S52572A Other intraarticular fracture of lower end of left radius, initial encounter for closed fracture: Secondary | ICD-10-CM | POA: Diagnosis not present

## 2016-10-04 DIAGNOSIS — F419 Anxiety disorder, unspecified: Secondary | ICD-10-CM | POA: Insufficient documentation

## 2016-10-04 DIAGNOSIS — Z96653 Presence of artificial knee joint, bilateral: Secondary | ICD-10-CM | POA: Insufficient documentation

## 2016-10-04 DIAGNOSIS — Z9889 Other specified postprocedural states: Secondary | ICD-10-CM | POA: Insufficient documentation

## 2016-10-04 HISTORY — PX: ORIF WRIST FRACTURE: SHX2133

## 2016-10-04 SURGERY — OPEN REDUCTION INTERNAL FIXATION (ORIF) WRIST FRACTURE
Anesthesia: General | Site: Wrist | Laterality: Left | Wound class: Clean

## 2016-10-04 MED ORDER — DEXAMETHASONE SODIUM PHOSPHATE 10 MG/ML IJ SOLN
INTRAMUSCULAR | Status: DC | PRN
  Administered 2016-10-04: 8 mg via INTRAVENOUS

## 2016-10-04 MED ORDER — MIDAZOLAM HCL 2 MG/2ML IJ SOLN
INTRAMUSCULAR | Status: AC
  Filled 2016-10-04: qty 2

## 2016-10-04 MED ORDER — HYDROMORPHONE HCL 1 MG/ML IJ SOLN
0.5000 mg | INTRAMUSCULAR | Status: DC | PRN
  Administered 2016-10-04: 0.5 mg via INTRAVENOUS

## 2016-10-04 MED ORDER — ONDANSETRON HCL 4 MG/2ML IJ SOLN
INTRAMUSCULAR | Status: DC | PRN
  Administered 2016-10-04: 4 mg via INTRAVENOUS

## 2016-10-04 MED ORDER — LIDOCAINE HCL (CARDIAC) 20 MG/ML IV SOLN
INTRAVENOUS | Status: DC | PRN
  Administered 2016-10-04: 100 mg via INTRAVENOUS

## 2016-10-04 MED ORDER — FENTANYL CITRATE (PF) 100 MCG/2ML IJ SOLN
INTRAMUSCULAR | Status: AC
  Administered 2016-10-04: 25 ug via INTRAVENOUS
  Filled 2016-10-04: qty 2

## 2016-10-04 MED ORDER — ONDANSETRON HCL 4 MG/2ML IJ SOLN: 4.0000 mg | Freq: Once | INTRAMUSCULAR | Status: DC | PRN

## 2016-10-04 MED ORDER — MEPERIDINE HCL 25 MG/ML IJ SOLN
INTRAMUSCULAR | Status: AC
  Filled 2016-10-04: qty 1

## 2016-10-04 MED ORDER — HYDROMORPHONE HCL 1 MG/ML IJ SOLN
INTRAMUSCULAR | Status: AC
Start: 2016-10-04 — End: 2016-10-04
  Administered 2016-10-04: 0.5 mg via INTRAVENOUS
  Filled 2016-10-04: qty 1

## 2016-10-04 MED ORDER — HYDROMORPHONE HCL 1 MG/ML IJ SOLN
0.5000 mg | INTRAMUSCULAR | Status: DC | PRN
  Administered 2016-10-04 (×2): 0.5 mg via INTRAVENOUS

## 2016-10-04 MED ORDER — NEOMYCIN-POLYMYXIN B GU 40-200000 IR SOLN
Status: DC | PRN
  Administered 2016-10-04: 2 mL

## 2016-10-04 MED ORDER — KETOROLAC TROMETHAMINE 30 MG/ML IJ SOLN
30.0000 mg | Freq: Once | INTRAMUSCULAR | Status: AC
Start: 2016-10-04 — End: 2016-10-04
  Administered 2016-10-04: 30 mg via INTRAVENOUS
  Filled 2016-10-04: qty 1

## 2016-10-04 MED ORDER — KETOROLAC TROMETHAMINE 30 MG/ML IJ SOLN
INTRAMUSCULAR | Status: AC
  Administered 2016-10-04: 30 mg via INTRAVENOUS
  Filled 2016-10-04: qty 1

## 2016-10-04 MED ORDER — MEPERIDINE HCL 25 MG/ML IJ SOLN
25.0000 mg | Freq: Once | INTRAMUSCULAR | Status: AC
  Administered 2016-10-04: 25 mg via INTRAVENOUS

## 2016-10-04 MED ORDER — FENTANYL CITRATE (PF) 100 MCG/2ML IJ SOLN
INTRAMUSCULAR | Status: DC | PRN
  Administered 2016-10-04: 50 ug via INTRAVENOUS
  Administered 2016-10-04: 100 ug via INTRAVENOUS

## 2016-10-04 MED ORDER — HYDROCODONE-ACETAMINOPHEN 5-325 MG PO TABS
1.0000 | ORAL_TABLET | ORAL | Status: DC | PRN
  Administered 2016-10-04: 2 via ORAL

## 2016-10-04 MED ORDER — LACTATED RINGERS IV SOLN
INTRAVENOUS | Status: DC
  Administered 2016-10-04 (×2): via INTRAVENOUS

## 2016-10-04 MED ORDER — HYDROCODONE-ACETAMINOPHEN 5-325 MG PO TABS: 1.0000 | ORAL_TABLET | Freq: Four times a day (QID) | ORAL | 0 refills | Status: DC | PRN

## 2016-10-04 MED ORDER — MIDAZOLAM HCL 2 MG/2ML IJ SOLN
1.0000 mg | INTRAMUSCULAR | Status: AC | PRN
  Administered 2016-10-04 (×2): 1 mg via INTRAVENOUS

## 2016-10-04 MED ORDER — CLINDAMYCIN PHOSPHATE 900 MG/50ML IV SOLN
900.0000 mg | Freq: Once | INTRAVENOUS | Status: AC
  Administered 2016-10-04: 900 mg via INTRAVENOUS

## 2016-10-04 MED ORDER — EPHEDRINE SULFATE 50 MG/ML IJ SOLN
INTRAMUSCULAR | Status: DC | PRN
  Administered 2016-10-04: 10 mg via INTRAVENOUS

## 2016-10-04 MED ORDER — CLINDAMYCIN PHOSPHATE 900 MG/50ML IV SOLN
INTRAVENOUS | Status: AC
  Filled 2016-10-04: qty 50

## 2016-10-04 MED ORDER — PROPOFOL 10 MG/ML IV BOLUS
INTRAVENOUS | Status: DC | PRN
  Administered 2016-10-04: 200 mg via INTRAVENOUS

## 2016-10-04 MED ORDER — NEOMYCIN-POLYMYXIN B GU 40-200000 IR SOLN
Status: AC
  Filled 2016-10-04: qty 2

## 2016-10-04 MED ORDER — HYDROMORPHONE HCL 1 MG/ML IJ SOLN
INTRAMUSCULAR | Status: AC
  Filled 2016-10-04: qty 1

## 2016-10-04 MED ORDER — HYDROCODONE-ACETAMINOPHEN 5-325 MG PO TABS
ORAL_TABLET | ORAL | Status: AC
  Administered 2016-10-04: 2 via ORAL
  Filled 2016-10-04: qty 2

## 2016-10-04 MED ORDER — FENTANYL CITRATE (PF) 100 MCG/2ML IJ SOLN
25.0000 ug | INTRAMUSCULAR | Status: AC | PRN
  Administered 2016-10-04 (×6): 25 ug via INTRAVENOUS

## 2016-10-04 SURGICAL SUPPLY — 46 items
BANDAGE ACE 4X5 VEL STRL LF (GAUZE/BANDAGES/DRESSINGS) ×6 IMPLANT
BANDAGE ELASTIC 4 LF NS (GAUZE/BANDAGES/DRESSINGS) ×3 IMPLANT
BNDG ESMARK 4X12 TAN STRL LF (GAUZE/BANDAGES/DRESSINGS) ×3 IMPLANT
CANISTER SUCT 1200ML W/VALVE (MISCELLANEOUS) ×3 IMPLANT
CHLORAPREP W/TINT 26ML (MISCELLANEOUS) ×3 IMPLANT
CUFF TOURN 18 STER (MISCELLANEOUS) ×3 IMPLANT
DRAPE FLUOR MINI C-ARM 54X84 (DRAPES) ×3 IMPLANT
ELECT CAUTERY BLADE 6.4 (BLADE) ×3 IMPLANT
ELECT REM PT RETURN 9FT ADLT (ELECTROSURGICAL) ×3
ELECTRODE REM PT RTRN 9FT ADLT (ELECTROSURGICAL) ×1 IMPLANT
GAUZE PETRO XEROFOAM 1X8 (MISCELLANEOUS) ×3 IMPLANT
GAUZE SPONGE 4X4 12PLY STRL (GAUZE/BANDAGES/DRESSINGS) ×3 IMPLANT
GLOVE BIOGEL PI IND STRL 9 (GLOVE) ×1 IMPLANT
GLOVE BIOGEL PI INDICATOR 9 (GLOVE) ×2
GLOVE SURG SYN 9.0  PF PI (GLOVE) ×2
GLOVE SURG SYN 9.0 PF PI (GLOVE) ×1 IMPLANT
GOWN SRG 2XL LVL 4 RGLN SLV (GOWNS) ×1 IMPLANT
GOWN STRL NON-REIN 2XL LVL4 (GOWNS) ×2
GOWN STRL REUS W/ TWL LRG LVL3 (GOWN DISPOSABLE) ×2 IMPLANT
GOWN STRL REUS W/TWL LRG LVL3 (GOWN DISPOSABLE) ×4
K-WIRE 1.6 (WIRE) ×2
K-WIRE FX5X1.6XNS BN SS (WIRE) ×1
KIT RM TURNOVER STRD PROC AR (KITS) ×3 IMPLANT
KWIRE FX5X1.6XNS BN SS (WIRE) ×1 IMPLANT
NEEDLE FILTER BLUNT 18X 1/2SAF (NEEDLE) ×2
NEEDLE FILTER BLUNT 18X1 1/2 (NEEDLE) ×1 IMPLANT
NS IRRIG 500ML POUR BTL (IV SOLUTION) ×3 IMPLANT
PACK EXTREMITY ARMC (MISCELLANEOUS) ×3 IMPLANT
PAD CAST CTTN 4X4 STRL (SOFTGOODS) ×1 IMPLANT
PADDING CAST COTTON 4X4 STRL (SOFTGOODS) ×2
PEG SUBCHONDRAL SMOOTH 2.0X16 (Peg) ×3 IMPLANT
PEG SUBCHONDRAL SMOOTH 2.0X18 (Peg) ×3 IMPLANT
PLATE SHORT 21.6X48.9 NRRW LT (Plate) ×3 IMPLANT
SCREW BN 12X3.5XNS CORT TI (Screw) ×1 IMPLANT
SCREW CORT 3.5X10 LNG (Screw) ×3 IMPLANT
SCREW CORT 3.5X12 (Screw) ×2 IMPLANT
SCREW MULTI DIRECT 20MM (Screw) ×3 IMPLANT
SCREW MULTI DIRECT 22MM (Screw) ×3 IMPLANT
SCREW MULTI DIRECT 24MM (Screw) ×3 IMPLANT
SPLINT CAST 1 STEP 3X12 (MISCELLANEOUS) ×3 IMPLANT
STOCKINETTE STRL 4IN 9604848 (GAUZE/BANDAGES/DRESSINGS) ×3 IMPLANT
SUT ETHILON 4-0 (SUTURE) ×2
SUT ETHILON 4-0 FS2 18XMFL BLK (SUTURE) ×1
SUT VICRYL 3-0 27IN (SUTURE) ×3 IMPLANT
SUTURE ETHLN 4-0 FS2 18XMF BLK (SUTURE) ×1 IMPLANT
SYR 3ML LL SCALE MARK (SYRINGE) ×3 IMPLANT

## 2016-10-04 NOTE — Transfer of Care (Signed)
Immediate Anesthesia Transfer of Care Note  Patient: Devon HurlMichael R Olsen  Procedure(s) Performed: Procedure(s): OPEN REDUCTION INTERNAL FIXATION (ORIF) WRIST FRACTURE (Left)  Patient Location: PACU  Anesthesia Type:General  Level of Consciousness: awake and sedated  Airway & Oxygen Therapy: Patient Spontanous Breathing and Patient connected to face mask oxygen  Post-op Assessment: Report given to RN and Post -op Vital signs reviewed and stable  Post vital signs: Reviewed and stable  Last Vitals:  Vitals:   10/04/16 1247  BP: (!) 176/86  Pulse: 68  Resp: 18  Temp: 36.7 C    Last Pain:  Vitals:   10/04/16 1247  TempSrc: Oral  PainSc: 2       Patients Stated Pain Goal: 1 (10/04/16 1247)  Complications: No apparent anesthesia complications

## 2016-10-04 NOTE — H&P (Signed)
Table of Contents for Progress Notes Devon Olsen, Devon Olsen, [place] - 10/03/2016 9:30 AM EST Frutoso ChaseMartin, Devon Olsen, CMA - 10/03/2016 9:30 AM EST   Devon Olsen, Devon Christopher, PA - 10/03/2016 9:30 AM EST Formatting of this note may be different from the original. Chief Complaint: Chief Complaint  Patient presents with  . Wrist Pain  left wrist fx   Devon Olsen is a 60 y.o. male who presents today for evaluation of left distal radius fracture. Patient fell yesterday 10/02/2016, tripped landing on a wheel barrel. Patient suffered a displaced distal radius fracture. Patient's pain is 5 out of 10. He was given Norco in the ER last night, has not had his prescription filled. He denies any numbness or tingling throughout the digits. He is right-hand dominant. He is very active. His pain is mostly along the distal radius and ulnar styloid. X-rays also show a small avulsion to the ulnar styloid.  Past Medical History: Past Medical History:  Diagnosis Date  . Arthritis  . H/O varicella  . Hypertension   Past Surgical History: Past Surgical History:  Procedure Laterality Date  . ADENOIDECTOMY  . GASTRIC BYPASS OPEN 03/2008  . Left knee arthroscopy 10/09/2001  . Left total knee arthroplasty 10/28/2015  Dr Ernest PineHooten  . MYRINGOTOMY  . Right knee arthroscopy Right 08/15/06, 03/07/07, 11/23/07  . Right subacromial decompression Right 03/11/2005  . Right total knee arthroplasty Right 05/12/2010  . TONSILLECTOMY   Past Family History: Family History  Problem Relation Age of Onset  . Allergies Mother  . High blood pressre (Hypertension) Mother  . High blood pressre (Hypertension) Father   Medications: Current Outpatient Prescriptions Ordered in Epic  Medication Sig Dispense Refill  . celecoxib (CELEBREX) 200 MG capsule Take 200 mg by mouth once daily.   . ciclopirox (PENLAC) 8 % topical nail solution Frequency:PHARMDIR Dosage:0.0 Instructions: Note:apply to affected nails daily until  health nail is completely grown out Dose: 1  . dextroamphetamine-amphetamine (ADDERALL XR) 30 MG XR capsule Take 30 mg by mouth every morning.   Devon Olsen. FLUoxetine (PROZAC) 20 MG capsule take 1 capsule by mouth once daily  . multivitamin with iron-minerals (VITAMINS AND MINERALS) tablet Take 1 tablet by mouth once daily.   Devon Olsen. omeprazole (PRILOSEC) 40 MG DR capsule Take 1 capsule by mouth once daily. 0  . HYDROcodone-acetaminophen (NORCO) 5-325 mg tablet Take 1-2 tablets by mouth every 6 (six) hours as needed for Pain. 40 tablet 0   No current Epic-ordered facility-administered medications on file.   Allergies: Allergies  Allergen Reactions  . Penicillins Anaphylaxis and Swelling    Review of Systems:  A comprehensive 14 point ROS was performed, reviewed by me today, and the pertinent orthopaedic findings are documented in the HPI.  Exam: BP (!) 180/100 (BP Location: Left upper arm, Patient Position: Sitting, BP Cuff Size: Adult)  Ht 180.3 cm (5\' 11" )  Wt (!) 126.5 kg (278 lb 12.8 oz)  BMI 38.88 kg/m   General:  Well developed, well nourished, no apparent distress, normal affect, normal gait with no antalgic component.   HEENT: Head normocephalic, atraumatic, PERRL.   Abdomen: Soft, non tender, non distended, Bowel sounds present.  Heart: Examination of the heart reveals regular, rate, and rhythm. There is no murmur noted on ascultation. There is a normal apical pulse.  Lungs: Lungs are clear to auscultation. There is no wheeze, rhonchi, or crackles. There is normal expansion of bilateral chest walls.   Left wrist: Examination of the left wrist shows the patient  is tender along the distal radius and ulnar styloid. There is slight deformity with moderate swelling noted. He has full composite fist with mild swelling throughout the digits. No skin breakdown noted. 2+ radial pulse and 2+ cap refill.  Imaging: X-rays of the left wrist reviewed by me from 10/02/2016 shows patient has an  intra-articular displaced distal radius fracture with avulsion of the ulnar styloid. Distal radius fracture is shortened and dorsally displaced.  Impression: Other closed intra-articular fracture of distal end of left radius, initial encounter [S52.572A] Other closed intra-articular fracture of distal end of left radius, initial encounter (primary encounter diagnosis)  Plan:  1. Risks, benefits, complications of a left distal radius ORIF were discussed with the patient. Patient has agreed and consented to the procedure with Dr. Rosita KeaMenz tomorrow on 10/04/2016.  This note was generated in part with voice recognition software and I apologize for any typographical errors that were not detected and corrected.  Devon Muscahomas Olsen Gaines MPA-C     Reviewed paper H+P, will be scanned into chart. No changes noted.

## 2016-10-04 NOTE — Op Note (Signed)
10/04/2016  4:39 PM  PATIENT:  Devon Olsen  60 y.o. male  PRE-OPERATIVE DIAGNOSIS:  otherclosed extraarticular fracture-left  POST-OPERATIVE DIAGNOSIS:  otherclosed extra-articular fracture-left  PROCEDURE:  Procedure(s): OPEN REDUCTION INTERNAL FIXATION (ORIF) WRIST FRACTURE (Left)  SURGEON: Leitha SchullerMichael J Cielle Aguila, MD  ASSISTANTS: None  ANESTHESIA:   general  EBL:  Total I/O In: 800 [I.V.:800] Out: 1 [Blood:1]  BLOOD ADMINISTERED:none  DRAINS: none   LOCAL MEDICATIONS USED:  NONE  SPECIMEN:  No Specimen  DISPOSITION OF SPECIMEN:  N/A  COUNTS:  YES  TOURNIQUET:   30 minutes 250 mm mercury  IMPLANTS: Biomet short narrow DVR plate left with multiple screw screws and pegs  DICTATION: .Dragon Dictation patient brought the operating room and after adequate anesthesia was obtained the left arm prepped draped in sterile fashion. Appropriate patient identification and timeout procedures were completed. Tourniquet was raised and volar approach is made centered over the FCR tendon. Tendon sheath was incised and the tendon retracted radially protect the artery and associated veins. Deep fascia incised in the pronator was elevated off the distal proximal fragments. The fracture fracture was reduced with the aid of fingertrap traction as well as a elevator to get the distal cortex more volar in its appropriate position after this was done essentially anatomic alignment was obtained. The plate was then applied and the distal smooth pegs were placed filling the distal portion of the plate then 2 proximal screws were placed. The last cortical screw could not place cruisers no drill sleeve present within the subcutaneous tissue screw should be adequate and a stable fracture pattern traction was released prior to setting all screws to prevent over distraction of the fracture site after all hardware had been placed the wounds were irrigated and then the tourniquet let down wound was closed with 3-0  Vicryl subcutaneously and 4-0 nylon for the skin. Xeroform 4 x 4 web roll and a volar splint were applied followed by an Ace wrap. Mini C-arm was used during the procedure to assess alignment and position of hardware  PLAN OF CARE: Discharge to home after PACU  PATIENT DISPOSITION:  PACU - hemodynamically stable.

## 2016-10-04 NOTE — Anesthesia Procedure Notes (Signed)
Procedure Name: LMA Insertion Date/Time: 10/04/2016 3:47 PM Performed by: Junious SilkNOLES, Rydan Gulyas Pre-anesthesia Checklist: Patient identified, Patient being monitored, Timeout performed, Emergency Drugs available and Suction available Patient Re-evaluated:Patient Re-evaluated prior to inductionOxygen Delivery Method: Circle system utilized Preoxygenation: Pre-oxygenation with 100% oxygen Intubation Type: IV induction Ventilation: Mask ventilation without difficulty LMA: LMA inserted LMA Size: 4.5 Tube type: Oral Number of attempts: 1 Placement Confirmation: positive ETCO2 and breath sounds checked- equal and bilateral Tube secured with: Tape Dental Injury: Teeth and Oropharynx as per pre-operative assessment

## 2016-10-04 NOTE — Discharge Instructions (Addendum)
AMBULATORY SURGERY  DISCHARGE INSTRUCTIONS   1) The drugs that you were given will stay in your system until tomorrow so for the next 24 hours you should not:  A) Drive an automobile B) Make any legal decisions C) Drink any alcoholic beverage   2) You may resume regular meals tomorrow.  Today it is better to start with liquids and gradually work up to solid foods.  You may eat anything you prefer, but it is better to start with liquids, then soup and crackers, and gradually work up to solid foods.   3) Please notify your doctor immediately if you have any unusual bleeding, trouble breathing, redness and pain at the surgery site, drainage, fever, or pain not relieved by medication.    4) Additional Instructions:        Please contact your physician with any problems or Same Day Surgery at [phone], Monday through Friday 6 am to 4 pm, or [place] [facility] Main number at [phone].  Keep splint clean and dry.  Keep arm elevated.  Ice to the back of the wrist for the next 2 days.  Work fingers is much as possible

## 2016-10-04 NOTE — Anesthesia Preprocedure Evaluation (Signed)
Anesthesia Evaluation  Patient identified by MRN, date of birth, ID band Patient awake    Reviewed: Allergy & Precautions, NPO status , Patient's Chart, lab work & pertinent test results  Airway Mallampati: II  TM Distance: <3 FB     Dental  (+) Chipped, Caps   Pulmonary former smoker,    Pulmonary exam normal        Cardiovascular hypertension, Normal cardiovascular exam     Neuro/Psych Anxiety PTSDnegative neurological ROS     GI/Hepatic Neg liver ROS, GERD  Medicated and Controlled,  Endo/Other  negative endocrine ROS  Renal/GU negative Renal ROS  negative genitourinary   Musculoskeletal  (+) Arthritis , Osteoarthritis,    Abdominal (+) + obese,   Peds negative pediatric ROS (+)  Hematology negative hematology ROS (+)   Anesthesia Other Findings Past Medical History: No date: ADD (attention deficit disorder) No date: ADHD (attention deficit hyperactivity disorder) No date: Arthritis No date: GERD (gastroesophageal reflux disease) No date: Hypertension Patient states he is 275-280 pounds  Reproductive/Obstetrics                             Anesthesia Physical Anesthesia Plan  ASA: II  Anesthesia Plan: General   Post-op Pain Management:    Induction: Intravenous  Airway Management Planned: LMA and Oral ETT  Additional Equipment:   Intra-op Plan:   Post-operative Plan: Extubation in OR  Informed Consent: I have reviewed the patients History and Physical, chart, labs and discussed the procedure including the risks, benefits and alternatives for the proposed anesthesia with the patient or authorized representative who has indicated his/her understanding and acceptance.   Dental advisory given  Plan Discussed with: CRNA and Surgeon  Anesthesia Plan Comments:         Anesthesia Quick Evaluation

## 2016-10-05 ENCOUNTER — Encounter: Payer: Self-pay | Admitting: Orthopedic Surgery

## 2016-10-05 NOTE — Anesthesia Postprocedure Evaluation (Signed)
Anesthesia Post Note  Patient: Devon HurlMichael R Olsen  Procedure(s) Performed: Procedure(s) (LRB): OPEN REDUCTION INTERNAL FIXATION (ORIF) WRIST FRACTURE (Left)  Patient location during evaluation: PACU Anesthesia Type: General Level of consciousness: awake and alert and oriented Pain management: pain level controlled Vital Signs Assessment: post-procedure vital signs reviewed and stable Respiratory status: spontaneous breathing Cardiovascular status: blood pressure returned to baseline Postop Assessment: no headache Anesthetic complications: no    Last Vitals:  Vitals:   10/04/16 1747 10/04/16 1808  BP: (!) 175/85 (!) 158/86  Pulse: 82   Resp: 16 16  Temp: 36.8 C     Last Pain:  Vitals:   10/05/16 1040  TempSrc:   PainSc: 0-No pain                 Gladyes Kudo

## 2019-09-05 ENCOUNTER — Encounter
Admission: RE | Admit: 2019-09-05 | Discharge: 2019-09-05 | Disposition: A | Discharge status: Home / Self Care | Payer: BC Managed Care – PPO | Source: Ambulatory Visit | Attending: Otolaryngology | Admitting: Otolaryngology

## 2019-09-05 ENCOUNTER — Other Ambulatory Visit: Payer: Self-pay

## 2019-09-05 DIAGNOSIS — I491 Atrial premature depolarization: Secondary | ICD-10-CM | POA: Insufficient documentation

## 2019-09-05 DIAGNOSIS — I10 Essential (primary) hypertension: Secondary | ICD-10-CM | POA: Diagnosis not present

## 2019-09-05 DIAGNOSIS — Z01818 Encounter for other preprocedural examination: Secondary | ICD-10-CM | POA: Diagnosis present

## 2019-09-05 DIAGNOSIS — J329 Chronic sinusitis, unspecified: Secondary | ICD-10-CM | POA: Insufficient documentation

## 2019-09-05 HISTORY — DX: Personal history of urinary calculi: Z87.442

## 2019-09-05 LAB — BASIC METABOLIC PANEL
Anion gap: 8 (ref 5–15)
BUN: 22 mg/dL (ref 8–23)
CO2: 22 mmol/L (ref 22–32)
Calcium: 9 mg/dL (ref 8.9–10.3)
Chloride: 112 mmol/L — ABNORMAL HIGH (ref 98–111)
Creatinine, Ser: 0.73 mg/dL (ref 0.61–1.24)
GFR calc Af Amer: >60 mL/min (ref >60)
GFR calc non Af Amer: >60 mL/min (ref >60)
Glucose, Bld: 95 mg/dL (ref 70–99)
Potassium: 3.6 mmol/L (ref 3.5–5.1)
Sodium: 142 mmol/L (ref 135–145)

## 2019-09-05 LAB — CBC
HCT: 44.7 % (ref 39.0–52.0)
Hemoglobin: 14.8 g/dL (ref 13.0–17.0)
MCH: 29.5 pg (ref 26.0–34.0)
MCHC: 33.1 g/dL (ref 30.0–36.0)
MCV: 89.2 fL (ref 80.0–100.0)
Platelets: UNDETERMINED 10*3/uL (ref 150–400)
RBC: 5.01 MIL/uL (ref 4.22–5.81)
RDW: 13.1 % (ref 11.5–15.5)
WBC: 8.1 10*3/uL (ref 4.0–10.5)
nRBC: 0 % (ref 0.0–0.2)

## 2019-09-05 NOTE — Pre-Procedure Instructions (Addendum)
Pre-Admit Testing Provider Communication Note  Provider Notified: Dr. Amie Critchley AND Dr. Ola Spurr  Notification Mode: Secure Chat  Reason: EKG result. "Good afternoon Devon Olsen! Can you take a peek at this patient's EKG from today? Shows premature supraventricular complexes, otherwise normal. I feel like its relatively benign. But need your input."  To Dr. Ola Spurr:  "Hey there Dr. Ola Spurr! We had Devon Olsen for the afternoon coverage and then [facility] switched. Can you check this out?"  Response: Okay to proceed.   Additional Information: Placed on chart. Noted on Pre-Admit Worksheet.   Signed: Beulah Gandy, RN

## 2019-09-05 NOTE — Patient Instructions (Signed)
Your procedure is scheduled on: Tuesday 09/10/19.  Report to DAY SURGERY DEPARTMENT LOCATED ON 2ND FLOOR MEDICAL MALL ENTRANCE. To find out your arrival time please call [phone] between 1PM - 3PM on Monday 09/09/19.    Remember: Instructions that are not followed completely may result in serious medical risk, up to and including death, or upon the discretion of your surgeon and anesthesiologist your surgery may need to be rescheduled.      _X__ 1. Do not eat food after midnight the night before your procedure.                 No gum chewing or hard candies. You may drink clear liquids up to 2 hours                 before you are scheduled to arrive for your surgery- DO NOT drink clear                 liquids within 2 hours of the start of your surgery.                 Clear Liquids include:  water, apple juice without pulp, clear carbohydrate                 drink such as Clearfast or Gatorade, Black Coffee or Tea (Do not add                 milk or creamer to coffee or tea).    __X__2.  On the morning of surgery brush your teeth with toothpaste and water, you may rinse your mouth with mouthwash if you wish.  Do not swallow any toothpaste or mouthwash.       _X__ 3.  No Alcohol for 24 hours before or after surgery.    __X__4.  Notify your doctor if there is any change in your medical condition      (cold, fever, infections).       Do not wear jewelry, make-up, hairpins, clips or nail polish. Do not wear lotions, powders, or perfumes.  Do not shave 48 hours prior to surgery. Men may shave face and neck. Do not bring valuables to the hospital.      [facility] is not responsible for any belongings or valuables.    Contacts, dentures/partials or body piercings may not be worn into surgery. Bring a case for your contacts, glasses or hearing aids, a denture cup will be supplied.     Patients discharged the day of surgery will not be allowed to drive  home.     __X__ Take these medicines the morning of surgery with A SIP OF WATER:     1. amLODipine (NORVASC) 5 MG tablet  2. FLUoxetine (PROZAC) 20 MG capsule  3. fluticasone (FLONASE) 50 MCG/ACT nasal spray  4. omeprazole (PRILOSEC) 40 MG capsule  5. oxybutynin (DITROPAN-XL) 10 MG 24 hr tablet  6. tamsulosin (FLOMAX) 0.4 MG CAPS capsule     __X__ Stop Anti-inflammatories 7 days before surgery such as Advil, Ibuprofen, Motrin, BC or Goodies Powder, Naprosyn, Naproxen, Aleve, Aspirin, Meloxicam, Celebrex. May take Tylenol if needed for pain or discomfort.     __X__ Don't start taking any new herbal supplements before your procedure.

## 2019-09-06 ENCOUNTER — Other Ambulatory Visit
Admission: RE | Admit: 2019-09-06 | Discharge: 2019-09-06 | Disposition: A | Discharge status: Home / Self Care | Payer: BC Managed Care – PPO | Source: Ambulatory Visit | Attending: Otolaryngology | Admitting: Otolaryngology

## 2019-09-06 DIAGNOSIS — Z20828 Contact with and (suspected) exposure to other viral communicable diseases: Secondary | ICD-10-CM | POA: Insufficient documentation

## 2019-09-06 DIAGNOSIS — Z01812 Encounter for preprocedural laboratory examination: Secondary | ICD-10-CM | POA: Diagnosis present

## 2019-09-06 LAB — SARS CORONAVIRUS 2 (TAT 6-24 HRS): SARS Coronavirus 2: NEGATIVE

## 2019-09-10 ENCOUNTER — Other Ambulatory Visit: Payer: Self-pay

## 2019-09-10 ENCOUNTER — Ambulatory Visit
Admission: RE | Admit: 2019-09-10 | Discharge: 2019-09-10 | Disposition: A | Discharge status: Home / Self Care | Payer: BC Managed Care – PPO | Source: Ambulatory Visit | Attending: Otolaryngology | Admitting: Otolaryngology

## 2019-09-10 ENCOUNTER — Ambulatory Visit: Payer: BC Managed Care – PPO | Admitting: Anesthesiology

## 2019-09-10 ENCOUNTER — Encounter
Admission: RE | Disposition: A | Discharge status: Home / Self Care | Payer: Self-pay | Source: Ambulatory Visit | Attending: Otolaryngology

## 2019-09-10 DIAGNOSIS — B479 Mycetoma, unspecified: Secondary | ICD-10-CM | POA: Diagnosis not present

## 2019-09-10 DIAGNOSIS — J320 Chronic maxillary sinusitis: Secondary | ICD-10-CM | POA: Insufficient documentation

## 2019-09-10 DIAGNOSIS — F419 Anxiety disorder, unspecified: Secondary | ICD-10-CM | POA: Diagnosis not present

## 2019-09-10 DIAGNOSIS — Z87891 Personal history of nicotine dependence: Secondary | ICD-10-CM | POA: Insufficient documentation

## 2019-09-10 DIAGNOSIS — I10 Essential (primary) hypertension: Secondary | ICD-10-CM | POA: Insufficient documentation

## 2019-09-10 DIAGNOSIS — K219 Gastro-esophageal reflux disease without esophagitis: Secondary | ICD-10-CM | POA: Diagnosis not present

## 2019-09-10 DIAGNOSIS — Z6841 Body Mass Index (BMI) 40.0 and over, adult: Secondary | ICD-10-CM | POA: Diagnosis not present

## 2019-09-10 DIAGNOSIS — Z880 Allergy status to penicillin: Secondary | ICD-10-CM | POA: Diagnosis not present

## 2019-09-10 DIAGNOSIS — F909 Attention-deficit hyperactivity disorder, unspecified type: Secondary | ICD-10-CM | POA: Insufficient documentation

## 2019-09-10 DIAGNOSIS — E669 Obesity, unspecified: Secondary | ICD-10-CM | POA: Insufficient documentation

## 2019-09-10 DIAGNOSIS — M1990 Unspecified osteoarthritis, unspecified site: Secondary | ICD-10-CM | POA: Insufficient documentation

## 2019-09-10 DIAGNOSIS — J329 Chronic sinusitis, unspecified: Secondary | ICD-10-CM | POA: Diagnosis present

## 2019-09-10 HISTORY — PX: MAXILLARY ANTROSTOMY: SHX2003

## 2019-09-10 SURGERY — MAXILLARY ANTROSTOMY
Anesthesia: General | Laterality: Right

## 2019-09-10 MED ORDER — OXYMETAZOLINE HCL 0.05 % NA SOLN
NASAL | Status: DC | PRN
  Administered 2019-09-10: 1 via TOPICAL

## 2019-09-10 MED ORDER — MIDAZOLAM HCL 2 MG/2ML IJ SOLN
INTRAMUSCULAR | Status: AC
  Filled 2019-09-10: qty 2

## 2019-09-10 MED ORDER — LACTATED RINGERS IV SOLN
INTRAVENOUS | Status: DC | PRN
  Administered 2019-09-10: 07:00:00 via INTRAVENOUS

## 2019-09-10 MED ORDER — LIDOCAINE-EPINEPHRINE 1 %-1:100000 IJ SOLN
INTRAMUSCULAR | Status: DC | PRN
  Administered 2019-09-10: 1 mL

## 2019-09-10 MED ORDER — FENTANYL CITRATE (PF) 100 MCG/2ML IJ SOLN: 25.0000 ug | INTRAMUSCULAR | Status: DC | PRN

## 2019-09-10 MED ORDER — LIDOCAINE HCL (CARDIAC) PF 100 MG/5ML IV SOSY
PREFILLED_SYRINGE | INTRAVENOUS | Status: DC | PRN
  Administered 2019-09-10: 100 mg via INTRAVENOUS

## 2019-09-10 MED ORDER — MIDAZOLAM HCL 2 MG/2ML IJ SOLN
INTRAMUSCULAR | Status: DC | PRN
  Administered 2019-09-10: 2 mg via INTRAVENOUS

## 2019-09-10 MED ORDER — FENTANYL CITRATE (PF) 100 MCG/2ML IJ SOLN
INTRAMUSCULAR | Status: AC
  Filled 2019-09-10: qty 2

## 2019-09-10 MED ORDER — BACITRACIN ZINC 500 UNIT/GM EX OINT
TOPICAL_OINTMENT | CUTANEOUS | Status: AC
  Filled 2019-09-10: qty 28.35

## 2019-09-10 MED ORDER — DEXAMETHASONE SODIUM PHOSPHATE 10 MG/ML IJ SOLN
INTRAMUSCULAR | Status: DC | PRN
  Administered 2019-09-10: 10 mg via INTRAVENOUS

## 2019-09-10 MED ORDER — SUCCINYLCHOLINE CHLORIDE 20 MG/ML IJ SOLN
INTRAMUSCULAR | Status: DC | PRN
  Administered 2019-09-10: 120 mg via INTRAVENOUS

## 2019-09-10 MED ORDER — CLINDAMYCIN HCL 300 MG PO CAPS: 300.0000 mg | ORAL_CAPSULE | Freq: Three times a day (TID) | ORAL | 0 refills | Status: AC

## 2019-09-10 MED ORDER — ROCURONIUM BROMIDE 100 MG/10ML IV SOLN
INTRAVENOUS | Status: DC | PRN
  Administered 2019-09-10: 10 mg via INTRAVENOUS
  Administered 2019-09-10: 30 mg via INTRAVENOUS

## 2019-09-10 MED ORDER — LIDOCAINE-EPINEPHRINE 1 %-1:100000 IJ SOLN
INTRAMUSCULAR | Status: AC
  Filled 2019-09-10: qty 1

## 2019-09-10 MED ORDER — SUGAMMADEX SODIUM 500 MG/5ML IV SOLN
INTRAVENOUS | Status: DC | PRN
  Administered 2019-09-10: 200 mg via INTRAVENOUS

## 2019-09-10 MED ORDER — ONDANSETRON HCL 4 MG/2ML IJ SOLN: 4.0000 mg | Freq: Once | INTRAMUSCULAR | Status: DC | PRN

## 2019-09-10 MED ORDER — DOCUSATE SODIUM 100 MG PO CAPS: 100.0000 mg | ORAL_CAPSULE | Freq: Two times a day (BID) | ORAL | 0 refills | Status: AC

## 2019-09-10 MED ORDER — OXYMETAZOLINE HCL 0.05 % NA SOLN
NASAL | Status: AC
  Filled 2019-09-10: qty 60

## 2019-09-10 MED ORDER — PROPOFOL 10 MG/ML IV BOLUS
INTRAVENOUS | Status: DC | PRN
  Administered 2019-09-10: 200 mg via INTRAVENOUS

## 2019-09-10 MED ORDER — ONDANSETRON HCL 4 MG/2ML IJ SOLN
INTRAMUSCULAR | Status: DC | PRN
  Administered 2019-09-10: 4 mg via INTRAVENOUS

## 2019-09-10 MED ORDER — FENTANYL CITRATE (PF) 100 MCG/2ML IJ SOLN
INTRAMUSCULAR | Status: DC | PRN
  Administered 2019-09-10 (×2): 50 ug via INTRAVENOUS

## 2019-09-10 MED ORDER — LACTATED RINGERS IV SOLN
INTRAVENOUS | Status: DC
  Administered 2019-09-10: 07:00:00 via INTRAVENOUS

## 2019-09-10 MED ORDER — HYDROCODONE-ACETAMINOPHEN 5-325 MG PO TABS: 1.0000 | ORAL_TABLET | ORAL | 0 refills | Status: AC | PRN

## 2019-09-10 MED ORDER — ONDANSETRON HCL 4 MG PO TABS: 4.0000 mg | ORAL_TABLET | Freq: Three times a day (TID) | ORAL | 0 refills | Status: AC | PRN

## 2019-09-10 SURGICAL SUPPLY — 38 items
BATTERY INSTRU NAVIGATION (MISCELLANEOUS) ×2 IMPLANT
CANISTER SUC SOCK COL 7IN (MISCELLANEOUS) ×3 IMPLANT
CANISTER SUCT 1200ML W/VALVE (MISCELLANEOUS) ×3 IMPLANT
CANISTER SUCT 3000ML PPV (MISCELLANEOUS) ×3 IMPLANT
CNTNR SPEC 2.5X3XGRAD LEK (MISCELLANEOUS) ×2
COAG SUCT 10F 3.5MM HAND CTRL (MISCELLANEOUS) ×3 IMPLANT
CONT SPEC 4OZ STER OR WHT (MISCELLANEOUS) ×4
CONTAINER SPEC 2.5X3XGRAD LEK (MISCELLANEOUS) ×2 IMPLANT
COVER WAND RF STERILE (DRAPES) ×3 IMPLANT
DRESSING NASL FOAM PST OP SINU (MISCELLANEOUS) IMPLANT
DRSG NASAL FOAM POST OP SINU (MISCELLANEOUS) ×3
ELECT REM PT RETURN 9FT ADLT (ELECTROSURGICAL) ×3
ELECTRODE REM PT RTRN 9FT ADLT (ELECTROSURGICAL) ×1 IMPLANT
GAUZE PACK 2X3YD (GAUZE/BANDAGES/DRESSINGS) ×3 IMPLANT
GLOVE BIO SURGEON STRL SZ7.5 (GLOVE) ×6 IMPLANT
GOWN STRL REUS W/ TWL LRG LVL3 (GOWN DISPOSABLE) ×2 IMPLANT
GOWN STRL REUS W/TWL LRG LVL3 (GOWN DISPOSABLE) ×4
IV NS 500ML (IV SOLUTION) ×2
IV NS 500ML BAXH (IV SOLUTION) ×1 IMPLANT
KIT TURNOVER KIT A (KITS) ×3 IMPLANT
NS IRRIG 500ML POUR BTL (IV SOLUTION) ×3 IMPLANT
PACK HEAD/NECK (MISCELLANEOUS) ×3 IMPLANT
PACKING NASAL EPIS 4X2.4 XEROG (MISCELLANEOUS) ×3 IMPLANT
PATTIES SURGICAL .5 X3 (DISPOSABLE) ×6 IMPLANT
SHAVER DIEGO BLD STD TYPE A (BLADE) ×3 IMPLANT
SOL ANTI-FOG 6CC FOG-OUT (MISCELLANEOUS) ×1 IMPLANT
SOL FOG-OUT ANTI-FOG 6CC (MISCELLANEOUS) ×2
SPLINT NASAL REUTER .5MM (MISCELLANEOUS) IMPLANT
SPLINT NASAL REUTER .5MM BIVLV (MISCELLANEOUS) IMPLANT
SUT CHROMIC 4 0 RB 1X27 (SUTURE) ×3 IMPLANT
SUT ETHILON 3 0 PS 1 (SUTURE) ×3 IMPLANT
SWAB CULTURE AMIES ANAERIB BLU (MISCELLANEOUS) ×2 IMPLANT
SYR 30ML LL (SYRINGE) ×3 IMPLANT
TRACKER CRANIALMASK (MASK) ×1 IMPLANT
TUBING CONNECTING 10 (TUBING) ×2 IMPLANT
TUBING CONNECTING 10' (TUBING) ×1
TUBING DECLOG MULTIDEBRIDER (TUBING) ×3 IMPLANT
WATER STERILE IRR 1000ML POUR (IV SOLUTION) ×2 IMPLANT

## 2019-09-10 NOTE — Anesthesia Procedure Notes (Signed)
Procedure Name: Intubation Date/Time: 09/10/2019 7:26 AM Performed by: Justus Memory, CRNA Pre-anesthesia Checklist: Patient identified, Patient being monitored, Timeout performed, Emergency Drugs available and Suction available Patient Re-evaluated:Patient Re-evaluated prior to induction Oxygen Delivery Method: Circle system utilized Preoxygenation: Pre-oxygenation with 100% oxygen Induction Type: IV induction Ventilation: Mask ventilation without difficulty Laryngoscope Size: Mac, 3 and McGraph Grade View: Grade I Tube type: Oral Tube size: 7.0 mm Number of attempts: 1 Airway Equipment and Method: Stylet and Video-laryngoscopy Placement Confirmation: ETT inserted through vocal cords under direct vision,  positive ETCO2 and breath sounds checked- equal and bilateral Secured at: 21 cm Tube secured with: Tape Dental Injury: Teeth and Oropharynx as per pre-operative assessment  Difficulty Due To: Difficult Airway- due to anterior larynx Future Recommendations: Recommend- induction with short-acting agent, and alternative techniques readily available

## 2019-09-10 NOTE — H&P (Signed)
..  History and Physical paper copy reviewed and updated date of procedure and will be scanned into system.  Patient seen and examined.  

## 2019-09-10 NOTE — Anesthesia Post-op Follow-up Note (Signed)
Anesthesia QCDR form completed.        

## 2019-09-10 NOTE — Transfer of Care (Signed)
Immediate Anesthesia Transfer of Care Note  Patient: Devon Olsen  Procedure(s) Performed: MAXILLARY ANTROSTOMY (Right )  Patient Location: PACU  Anesthesia Type:General  Level of Consciousness: sedated  Airway & Oxygen Therapy: Patient Spontanous Breathing and Patient connected to face mask oxygen  Post-op Assessment: Report given to RN and Post -op Vital signs reviewed and stable  Post vital signs: Reviewed and stable  Last Vitals:  Vitals Value Taken Time  BP 159/76 09/10/19 0851  Temp 36.7 C 09/10/19 0850  Pulse 77 09/10/19 0854  Resp 14 09/10/19 0854  SpO2 96 % 09/10/19 0854  Vitals shown include unvalidated device data.  Last Pain:  Vitals:   09/10/19 0625  TempSrc: Oral  PainSc: 0-No pain         Complications: No apparent anesthesia complications

## 2019-09-10 NOTE — Op Note (Signed)
..  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  The patient was taken to the operating room and with the patient in a comfortable supine position,  general orotracheal anesthesia was induced without difficulty.  A proper time-out was performed.    The patient next received preoperative Afrin spray for topical decongestion and vasoconstriction and 1% Xylocaine with 1:100,000 epinephrine, 4 cc's, was infiltrated into the right inferior turbinate, septum, and anterior middle turbinate.  Several minutes were allowed for this to take effect.  Cottoniod pledgets soaked in Afrin were placed into the patients right nasal cavity and left while the patient was prepped and draped in the standard fashion.  The materials were removed from the nose and observed to be intact and correct in number.  The nose was next inspected with a zero degree endoscope and the right middle turbinates was medialized and afrin soaked pledgets were placed lateral to the middle turbinates for approximately one minute.  The right uncinate process was infractured with a maxillary ostia seeker.  At this time attention was directed to the patient's maxillary sinuses.  A ball tipped probe was placed through the natural ostia and this was used to create a larger opening.  Hemostasis was performed  with topical Afrin soaked pledgets. Using combination of back biting forceps and [name], a large maxillary antrostomy was created.  This demonstrated a maxillary sinus filled with apparent fungal ball.  Using combination of curved suction, frontal sinus giraffe, and frontal sinus ostia seeker, the contents of the right maxillary sinus were cleared.  The contents were sent for pathological evaluation as well as for culture.  At this time with all planned sinuses opened, the patient's nasal cavity was examinated and copiously irrigated with sterile saline.  Meticulous hemostasis was continued and all sinuses were examined and noted to be widely patent.    Stamberger sinufoam was placed along the cut edge of the right maxillary antrostomy and Xerogel was placed lateral to the middle turbinate and inflated with sterile saline.  Care of the patient at this time was transferred to anesthesia and was extubated and taken to PACU in good condition.   Dispo:   PACU to home  Plan: Ice, elevation, narcotic analgesia and prophylactic antibiotics.  We will reevaluate the patient in the office in 7 days.   Jeannie Fend [name] 09/10/2019 8:39 AM

## 2019-09-10 NOTE — Discharge Instructions (Signed)
AMBULATORY SURGERY  °DISCHARGE INSTRUCTIONS ° ° °1) The drugs that you were given will stay in your system until tomorrow so for the next 24 hours you should not: ° °A) Drive an automobile °B) Make any legal decisions °C) Drink any alcoholic beverage ° ° °2) You may resume regular meals tomorrow.  Today it is better to start with liquids and gradually work up to solid foods. ° °You may eat anything you prefer, but it is better to start with liquids, then soup and crackers, and gradually work up to solid foods. ° ° °3) Please notify your doctor immediately if you have any unusual bleeding, trouble breathing, redness and pain at the surgery site, drainage, fever, or pain not relieved by medication. ° ° ° °4) Additional Instructions: ° ° ° ° ° ° ° °Please contact your physician with any problems or Same Day Surgery at [phone], Monday through Friday 6 am to 4 pm, or [place] [facility] [place] Main number at [phone]. °

## 2019-09-10 NOTE — Progress Notes (Signed)
Spit up large clot

## 2019-09-10 NOTE — Anesthesia Postprocedure Evaluation (Signed)
Anesthesia Post Note  Patient: Devon Olsen  Procedure(s) Performed: MAXILLARY ANTROSTOMY (Right )  Patient location during evaluation: PACU Anesthesia Type: General Level of consciousness: awake and alert and oriented Pain management: pain level controlled Vital Signs Assessment: post-procedure vital signs reviewed and stable Respiratory status: spontaneous breathing Cardiovascular status: blood pressure returned to baseline Anesthetic complications: no     Last Vitals:  Vitals:   09/10/19 0942 09/10/19 0956  BP: (!) 162/73 (!) 152/72  Pulse: 71   Resp: 18 18  Temp: (!) 36.4 C   SpO2: 95% 97%    Last Pain:  Vitals:   09/10/19 0942  TempSrc: Temporal  PainSc:                  Yadier Bramhall

## 2019-09-10 NOTE — Anesthesia Preprocedure Evaluation (Signed)
Anesthesia Evaluation  Patient identified by MRN, date of birth, ID band Patient awake    Reviewed: Allergy & Precautions, NPO status , Patient's Chart, lab work & pertinent test results  Airway Mallampati: II  TM Distance: <3 FB     Dental  (+) Chipped, Caps   Pulmonary former smoker,    Pulmonary exam normal        Cardiovascular hypertension, Normal cardiovascular exam     Neuro/Psych Anxiety PTSDnegative neurological ROS     GI/Hepatic Neg liver ROS, GERD  Medicated and Controlled,  Endo/Other  negative endocrine ROS  Renal/GU negative Renal ROS  negative genitourinary   Musculoskeletal  (+) Arthritis , Osteoarthritis,    Abdominal (+) + obese,   Peds negative pediatric ROS (+)  Hematology negative hematology ROS (+)   Anesthesia Other Findings Past Medical History: No date: ADD (attention deficit disorder) No date: ADHD (attention deficit hyperactivity disorder) No date: Arthritis No date: GERD (gastroesophageal reflux disease) No date: Hypertension Patient states he is 275-280 pounds  Reproductive/Obstetrics                             Anesthesia Physical  Anesthesia Plan  ASA: II  Anesthesia Plan: General   Post-op Pain Management:    Induction: Intravenous  PONV Risk Score and Plan:   Airway Management Planned: Oral ETT  Additional Equipment:   Intra-op Plan:   Post-operative Plan: Extubation in OR  Informed Consent: I have reviewed the patients History and Physical, chart, labs and discussed the procedure including the risks, benefits and alternatives for the proposed anesthesia with the patient or authorized representative who has indicated his/her understanding and acceptance.     Dental advisory given  Plan Discussed with: CRNA and Surgeon  Anesthesia Plan Comments:         Anesthesia Quick Evaluation

## 2019-09-11 ENCOUNTER — Encounter: Payer: Self-pay | Admitting: Otolaryngology

## 2019-09-11 LAB — SURGICAL PATHOLOGY

## 2019-09-13 LAB — AEROBIC CULTURE W GRAM STAIN (SUPERFICIAL SPECIMEN): Gram Stain: NONE SEEN

## 2019-10-01 LAB — CULTURE, FUNGUS WITHOUT SMEAR

## 2020-12-01 DEATH — deceased
